# Patient Record
Sex: Female | Born: 1970
Health system: Southern US, Community
[De-identification: ages and names within clinical notes are randomized; demographics above are authoritative.]

## PROBLEM LIST (undated history)

## (undated) DIAGNOSIS — K5090 Crohn's disease, unspecified, without complications: Secondary | ICD-10-CM

## (undated) DIAGNOSIS — R569 Unspecified convulsions: Secondary | ICD-10-CM

## (undated) DIAGNOSIS — I639 Cerebral infarction, unspecified: Secondary | ICD-10-CM

---

## 2001-09-02 ENCOUNTER — Ambulatory Visit (HOSPITAL_COMMUNITY): Admission: RE | Admit: 2001-09-02 | Discharge: 2001-09-02 | Payer: Self-pay | Admitting: *Deleted

## 2001-09-30 ENCOUNTER — Other Ambulatory Visit: Admission: RE | Admit: 2001-09-30 | Discharge: 2001-09-30 | Payer: Self-pay | Admitting: *Deleted

## 2001-12-26 ENCOUNTER — Ambulatory Visit (HOSPITAL_COMMUNITY): Admission: RE | Admit: 2001-12-26 | Discharge: 2001-12-26 | Payer: Self-pay | Admitting: *Deleted

## 2001-12-26 ENCOUNTER — Encounter: Payer: Self-pay | Admitting: *Deleted

## 2002-03-17 ENCOUNTER — Ambulatory Visit (HOSPITAL_COMMUNITY): Admission: RE | Admit: 2002-03-17 | Discharge: 2002-03-17 | Payer: Self-pay | Admitting: *Deleted

## 2004-05-13 ENCOUNTER — Ambulatory Visit: Payer: Self-pay | Admitting: Gastroenterology

## 2004-10-19 ENCOUNTER — Emergency Department: Payer: Self-pay | Admitting: Emergency Medicine

## 2006-09-07 ENCOUNTER — Inpatient Hospital Stay: Payer: Self-pay | Admitting: Obstetrics and Gynecology

## 2006-11-27 ENCOUNTER — Observation Stay: Payer: Self-pay | Admitting: Obstetrics and Gynecology

## 2006-12-03 ENCOUNTER — Inpatient Hospital Stay: Payer: Self-pay | Admitting: Obstetrics and Gynecology

## 2006-12-10 ENCOUNTER — Ambulatory Visit: Payer: Self-pay | Admitting: Obstetrics and Gynecology

## 2007-05-06 ENCOUNTER — Ambulatory Visit: Payer: Self-pay | Admitting: Family Medicine

## 2008-05-15 IMAGING — US US EXTREM LOW VENOUS BILAT
1 series · 17 of 24 positions shown · non-contrast
Comparison: none

REASON FOR EXAM: SOB  both feet swelling back pain dizzy spells  recently
delivered c section...
COMMENTS:

[Series 1: us extrem low venous bilat · 17 of 38 slices shown]
[im 1/38]
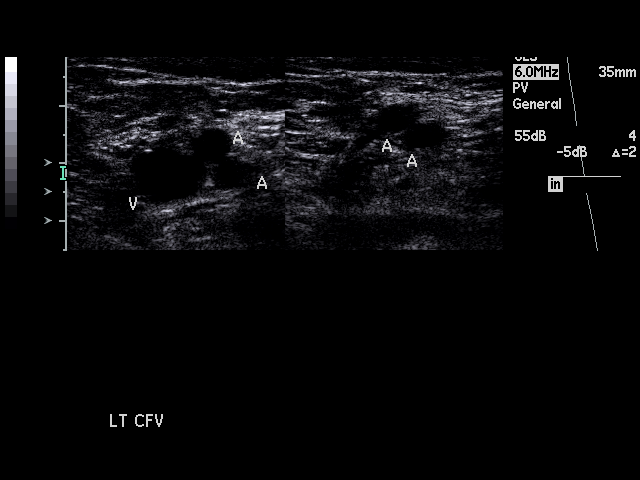
[im 4/38]
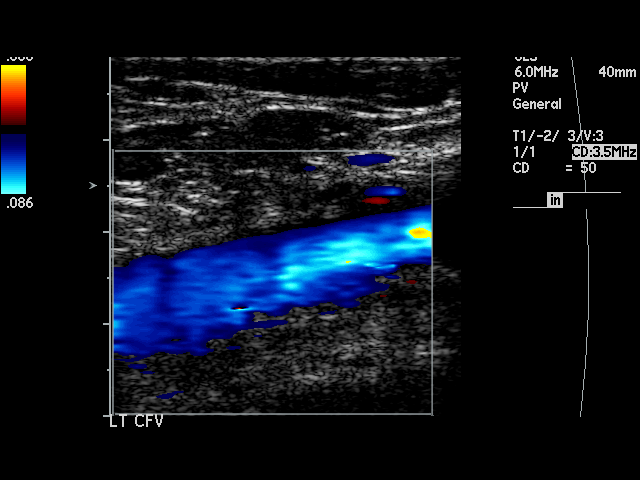
[im 5/38]
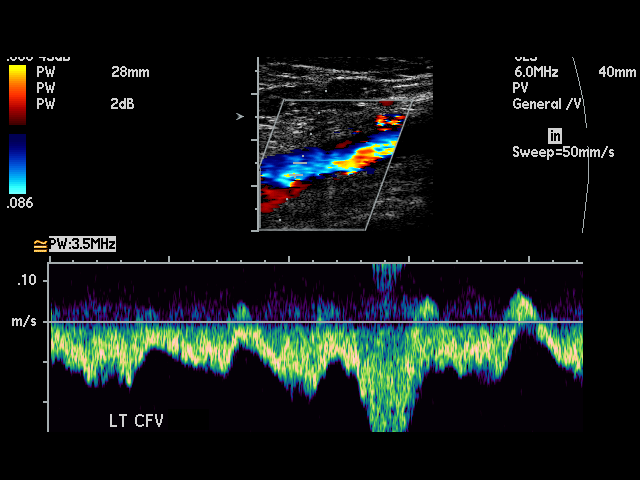
[im 7/38]
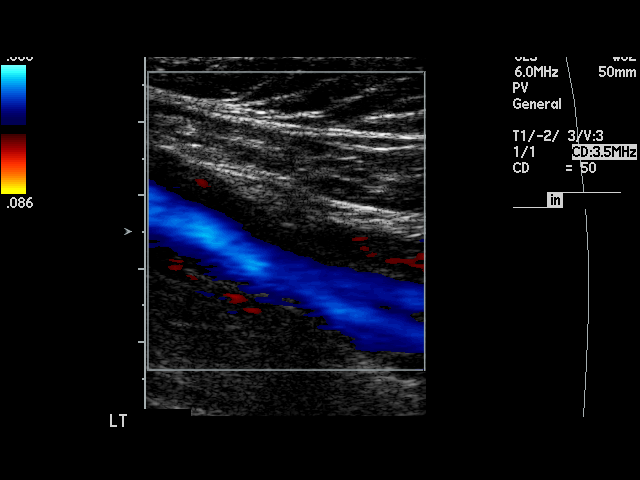
[im 10/38]
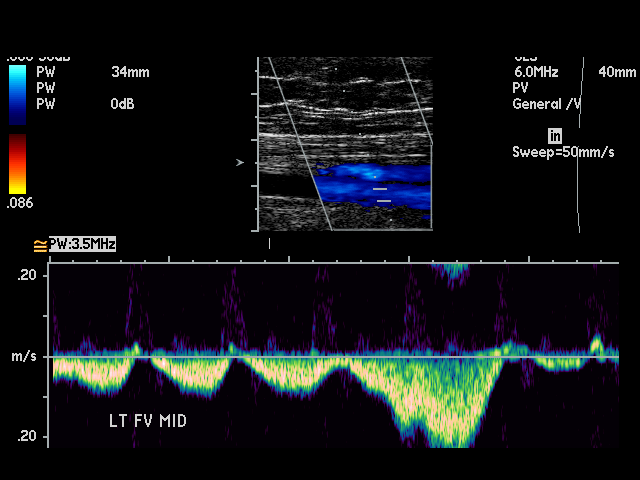
[im 12/38]
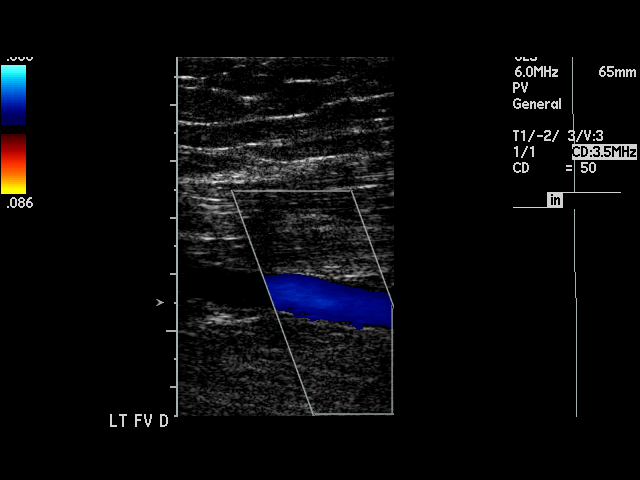
[im 15/38]
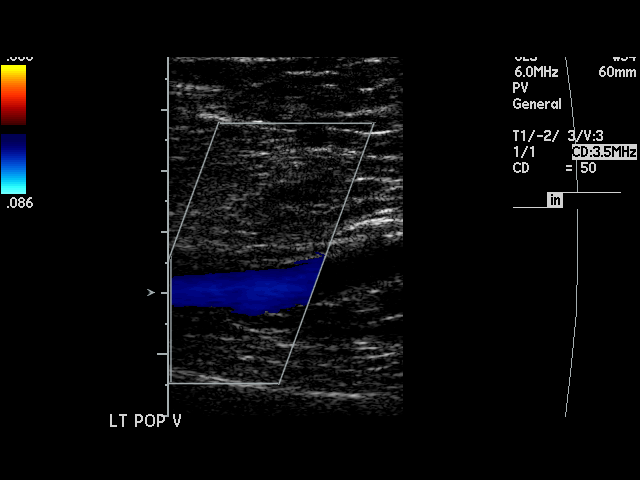
[im 17/38]
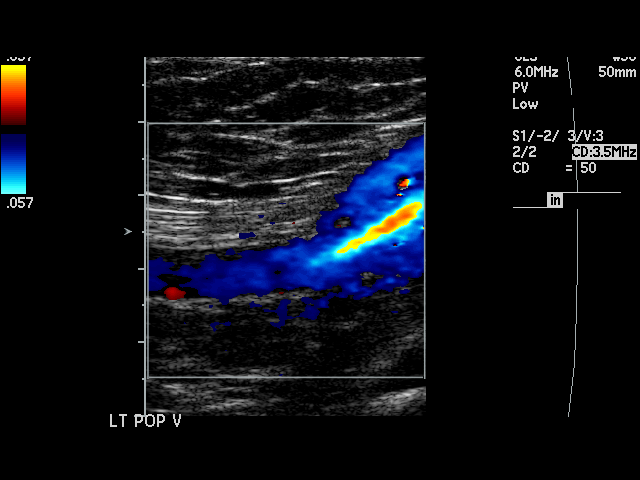
[im 20/38]
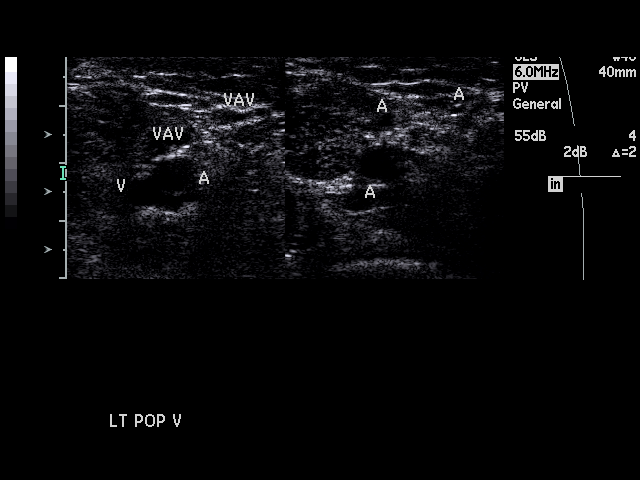
[im 21/38]
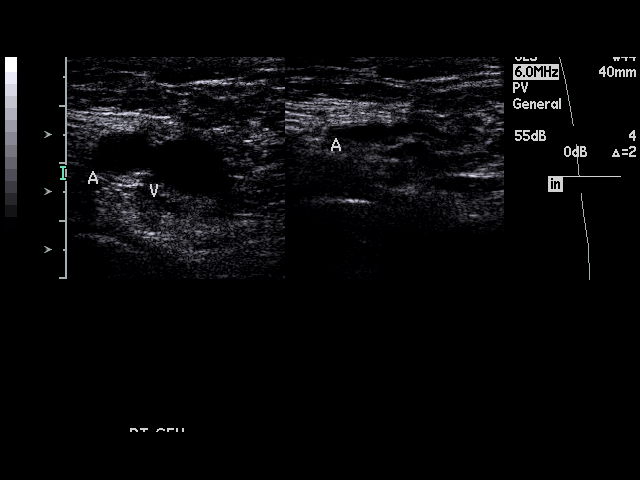
[im 23/38]
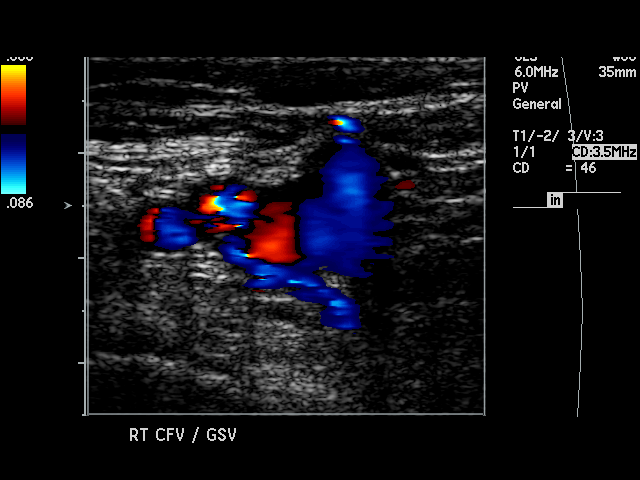
[im 26/38]
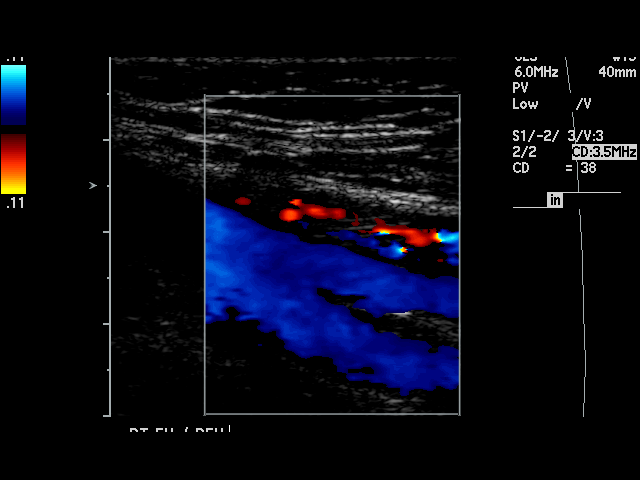
[im 28/38]
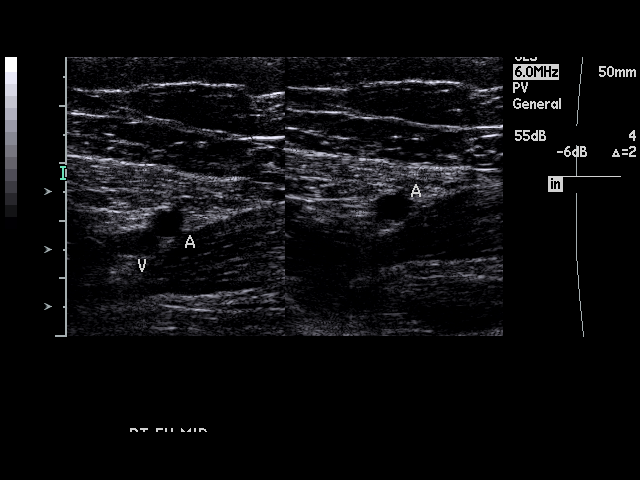
[im 31/38]
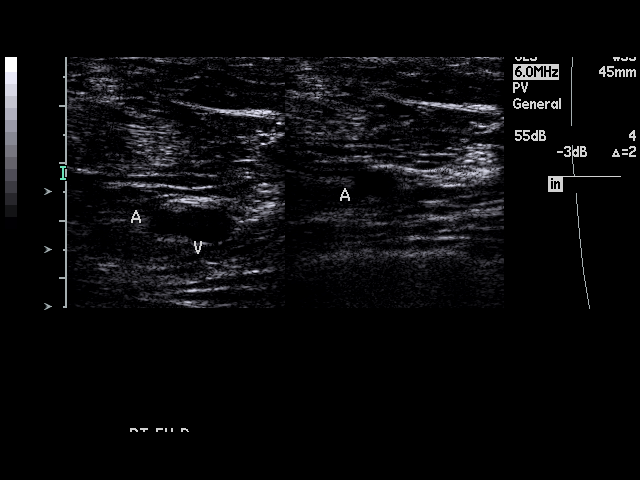
[im 33/38]
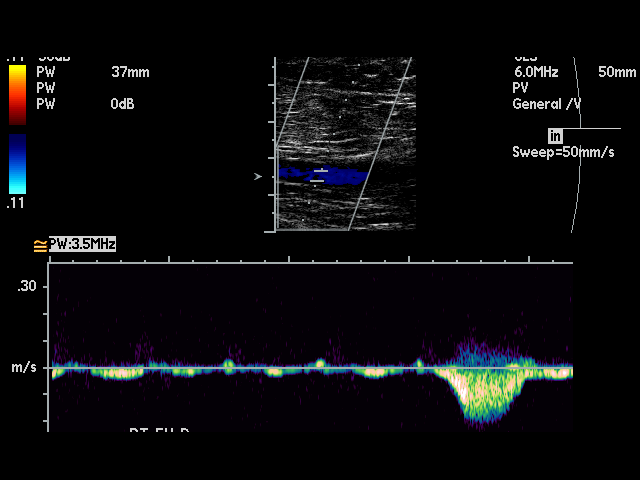
[im 34/38]
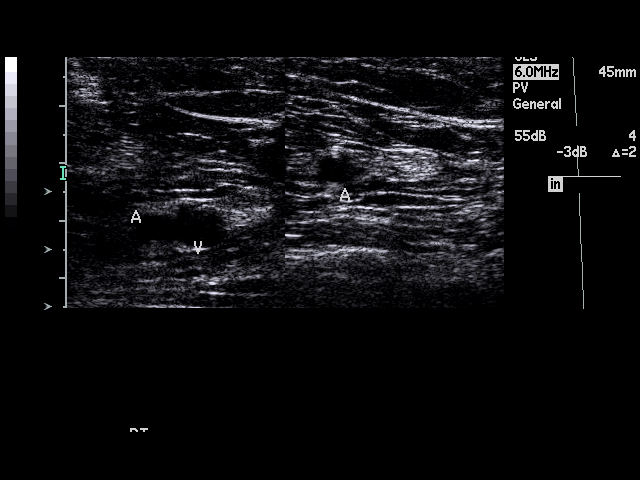
[im 38/38]
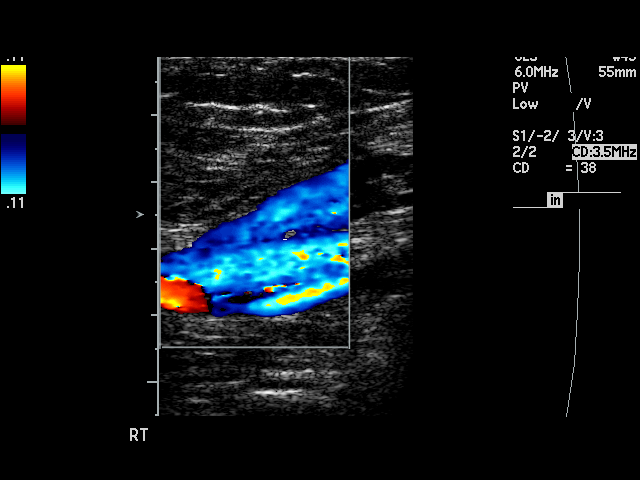

[17 of 24 positions shown; findings below may reference images not displayed]

PROCEDURE:     US  - US DOPPLER LOW EXTR BILATERAL  - December 10, 2006  [DATE]

RESULT:     Gray scale, Duplex, color flow and SPECTRAL waveform imaging was
performed of the deep venous structures of the RIGHT and LEFT lower
extremity.

There is no evidence of increased echogenicity or non-compressibility within
the interrogated deep venous structures of the RIGHT and LEFT lower
extremity. There is no evidence of abnormal waveform or gray scale flow
appreciated.
IMPRESSION: 1)No ultrasound evidence of deep venous thrombus within the RIGHT or LEFT
lower extremity as described above.

## 2008-10-09 IMAGING — CR CERVICAL SPINE - COMPLETE 4+ VIEW
1 series · 6 of 6 positions shown · non-contrast
Comparison: none

REASON FOR EXAM: vma, neck pain
COMMENTS:

[Series 1: view not recorded · 0.17mm/px · 6 of 6 slices shown]
[im 1/6]
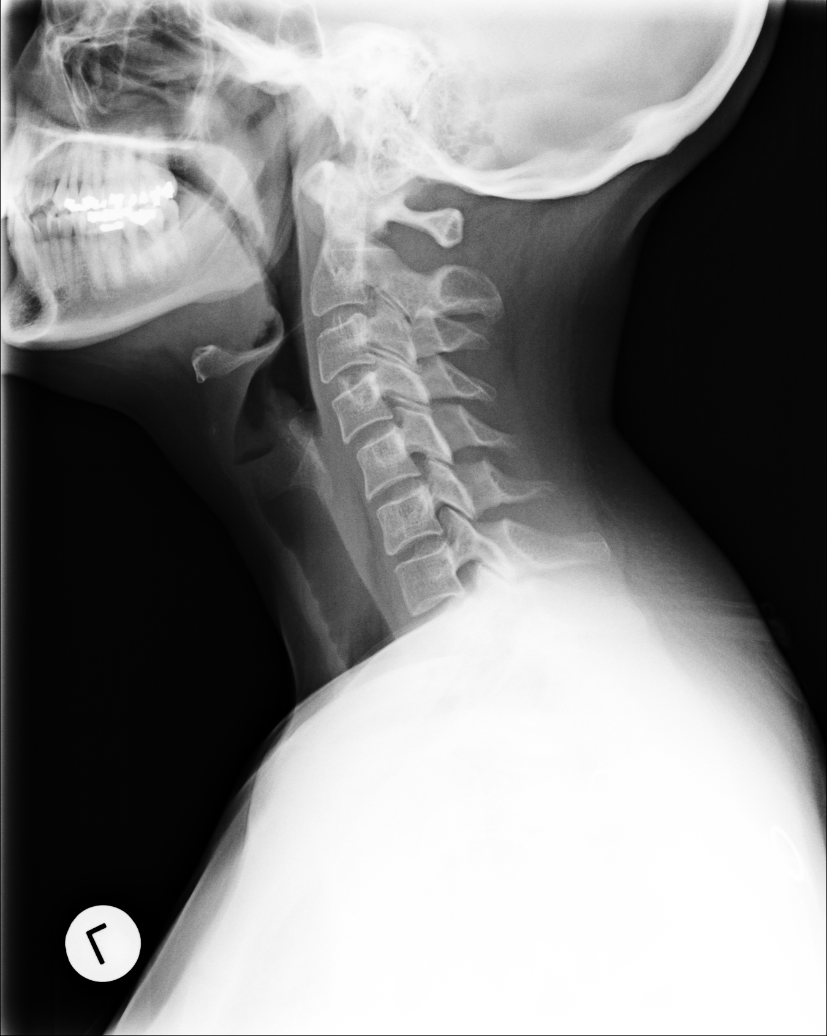
[im 2/6]
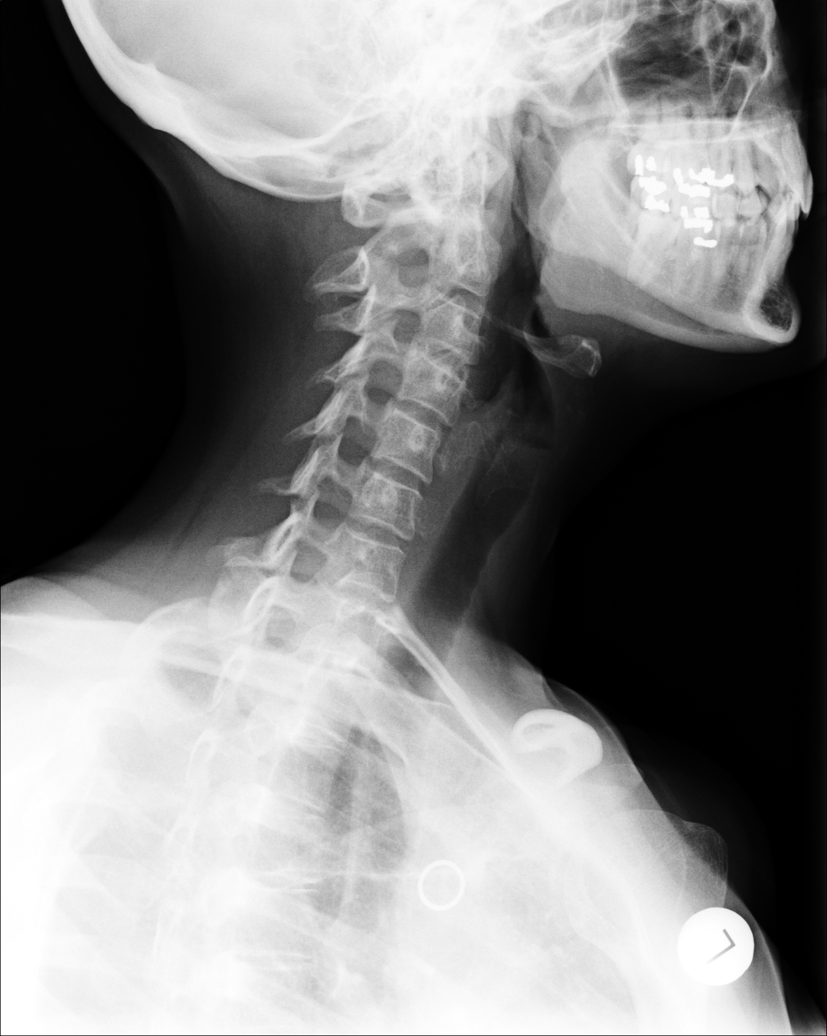
[im 3/6]
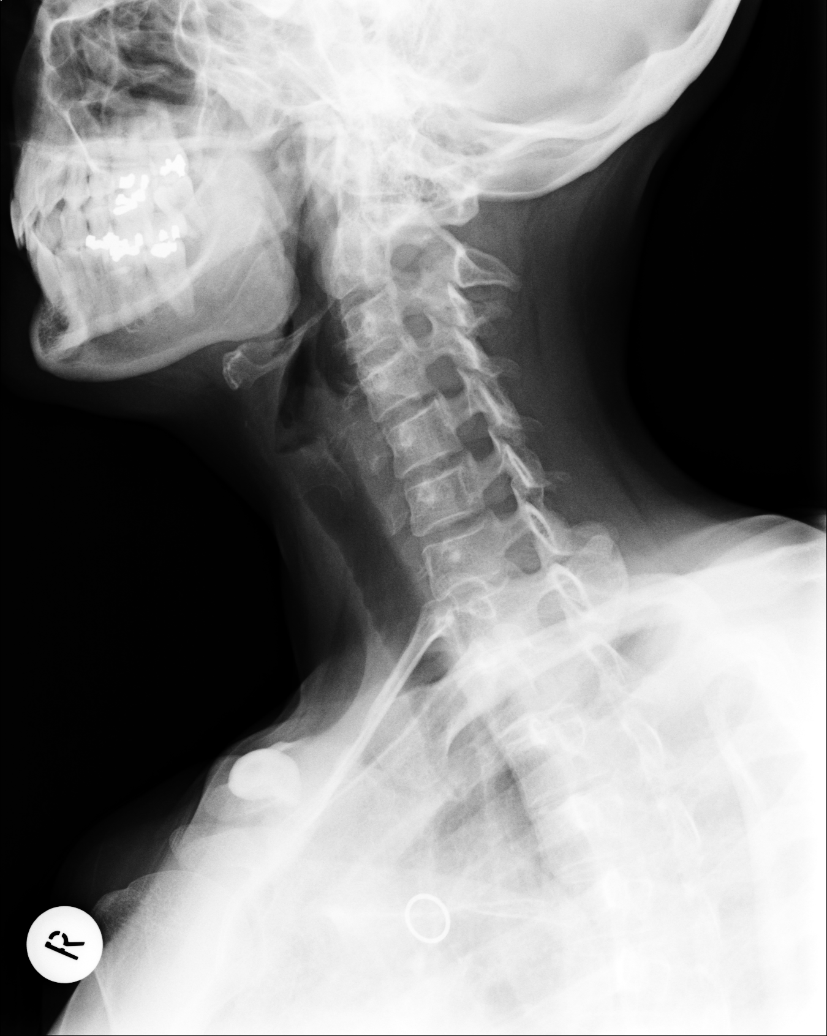
[im 4/6]
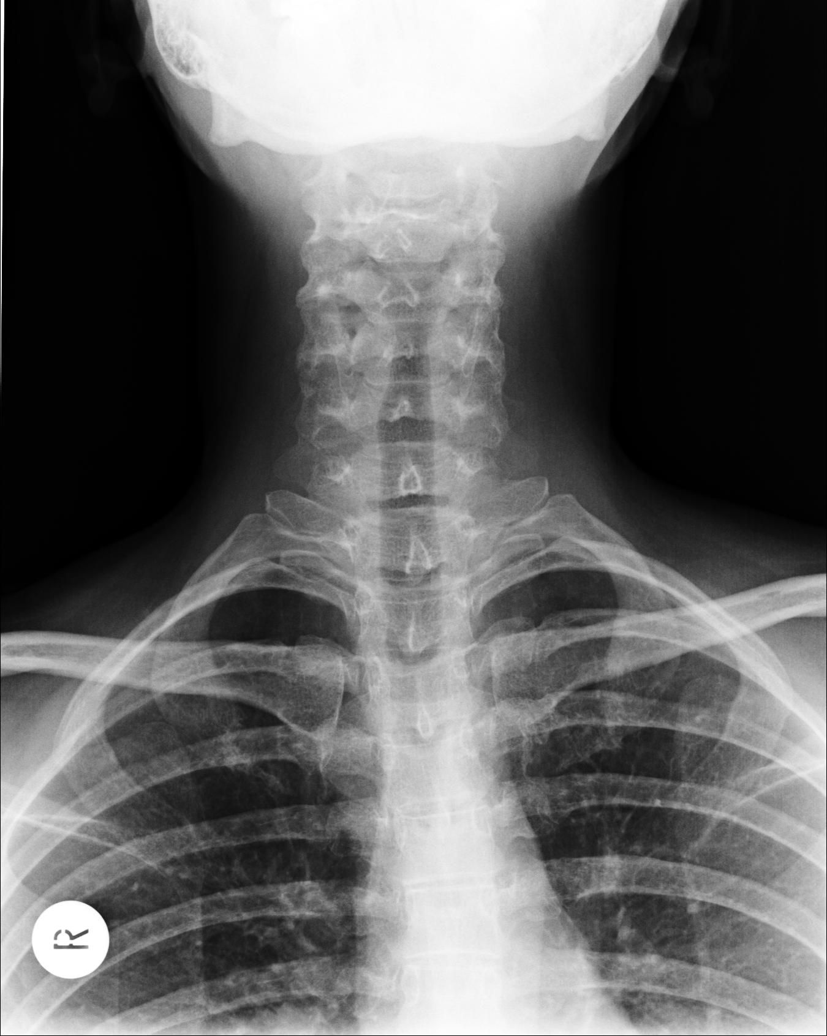
[im 5/6]
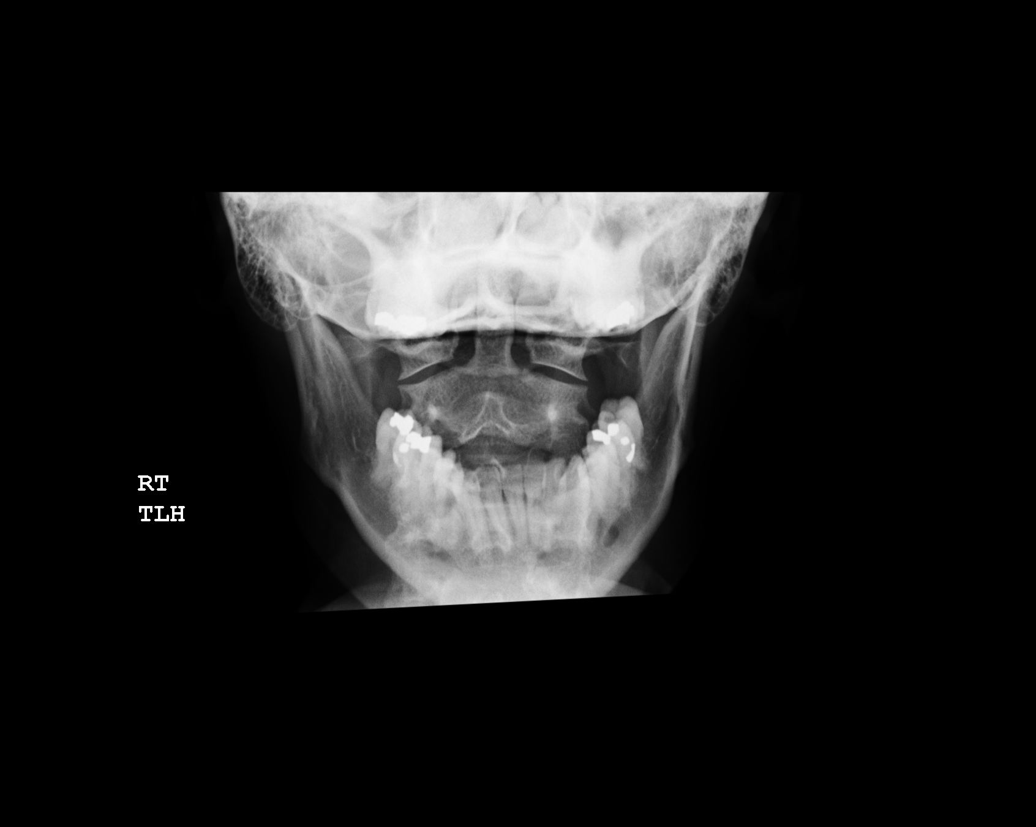
[im 6/6]
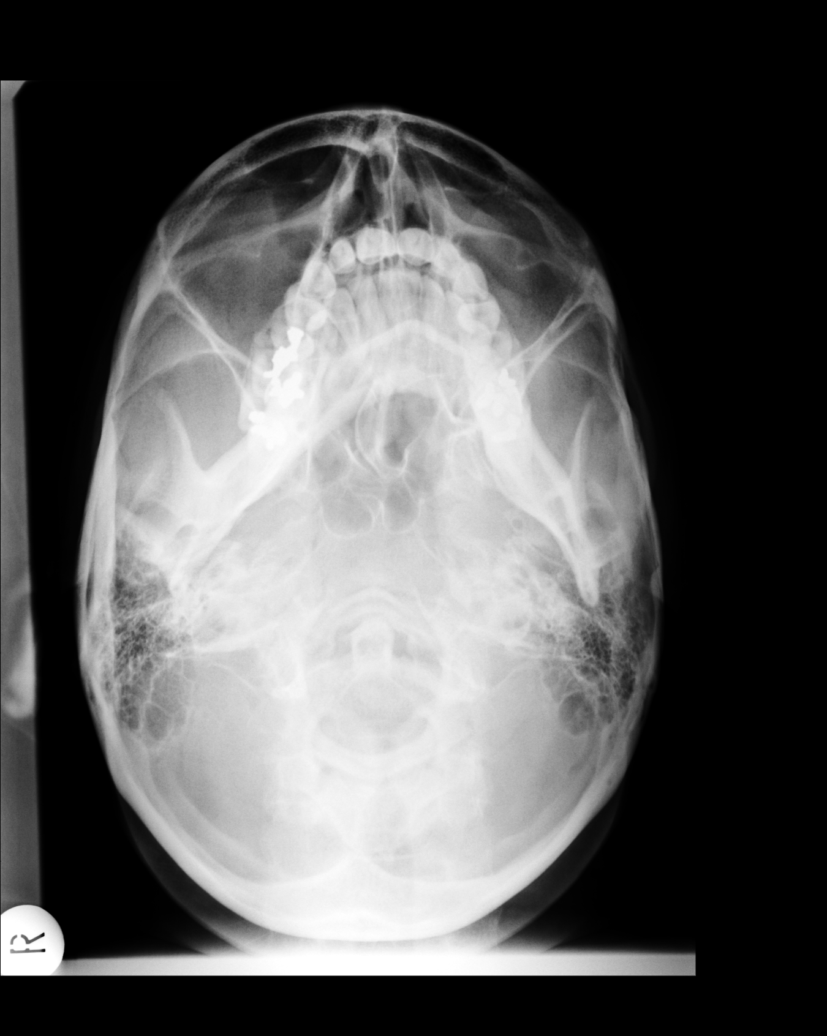

[6 of 6 positions shown; findings below may reference images not displayed]

PROCEDURE:     MDR - MDR CERVICAL SPINE COMPLETE  - May 06, 2007  [DATE]

RESULT:     The cervicovertebral bodies are preserved in height. The
prevertebral soft tissue spaces are normal in appearance. The posterior
elements appear intact. The oblique views reveal no bony encroachment upon
the neural foramina.
IMPRESSION: I do not see acute cervical spine abnormality.

## 2009-02-06 ENCOUNTER — Ambulatory Visit: Payer: Self-pay | Admitting: Internal Medicine

## 2010-03-29 ENCOUNTER — Ambulatory Visit: Payer: Self-pay | Admitting: Family Medicine

## 2010-08-05 ENCOUNTER — Ambulatory Visit: Payer: Self-pay | Admitting: Family Medicine

## 2011-05-13 ENCOUNTER — Inpatient Hospital Stay: Payer: Self-pay | Admitting: Internal Medicine

## 2011-06-19 DIAGNOSIS — K5090 Crohn's disease, unspecified, without complications: Secondary | ICD-10-CM | POA: Insufficient documentation

## 2011-12-24 ENCOUNTER — Ambulatory Visit: Payer: Self-pay | Admitting: Medical

## 2012-01-01 ENCOUNTER — Ambulatory Visit: Payer: Self-pay | Admitting: Obstetrics and Gynecology

## 2012-01-14 DIAGNOSIS — L409 Psoriasis, unspecified: Secondary | ICD-10-CM | POA: Insufficient documentation

## 2012-01-14 DIAGNOSIS — M2550 Pain in unspecified joint: Secondary | ICD-10-CM | POA: Insufficient documentation

## 2012-01-28 DIAGNOSIS — R519 Headache, unspecified: Secondary | ICD-10-CM | POA: Insufficient documentation

## 2012-02-22 DIAGNOSIS — D509 Iron deficiency anemia, unspecified: Secondary | ICD-10-CM | POA: Insufficient documentation

## 2012-02-22 DIAGNOSIS — Z7901 Long term (current) use of anticoagulants: Secondary | ICD-10-CM | POA: Insufficient documentation

## 2012-02-22 DIAGNOSIS — E876 Hypokalemia: Secondary | ICD-10-CM | POA: Insufficient documentation

## 2012-05-13 ENCOUNTER — Ambulatory Visit: Payer: Self-pay | Admitting: Internal Medicine

## 2012-05-15 ENCOUNTER — Ambulatory Visit: Payer: Self-pay

## 2012-05-17 LAB — WOUND CULTURE

## 2012-10-16 IMAGING — CR DG CHEST 1V PORT
1 series · 1 of 1 positions shown · non-contrast
Comparison: none

REASON FOR EXAM: seizure
COMMENTS:

PROCEDURE:     DXR - DXR PORTABLE CHEST SINGLE VIEW  - May 13, 2011  [DATE]
RESULT:     The lungs are clear. Heart size is normal.

[view not recorded]
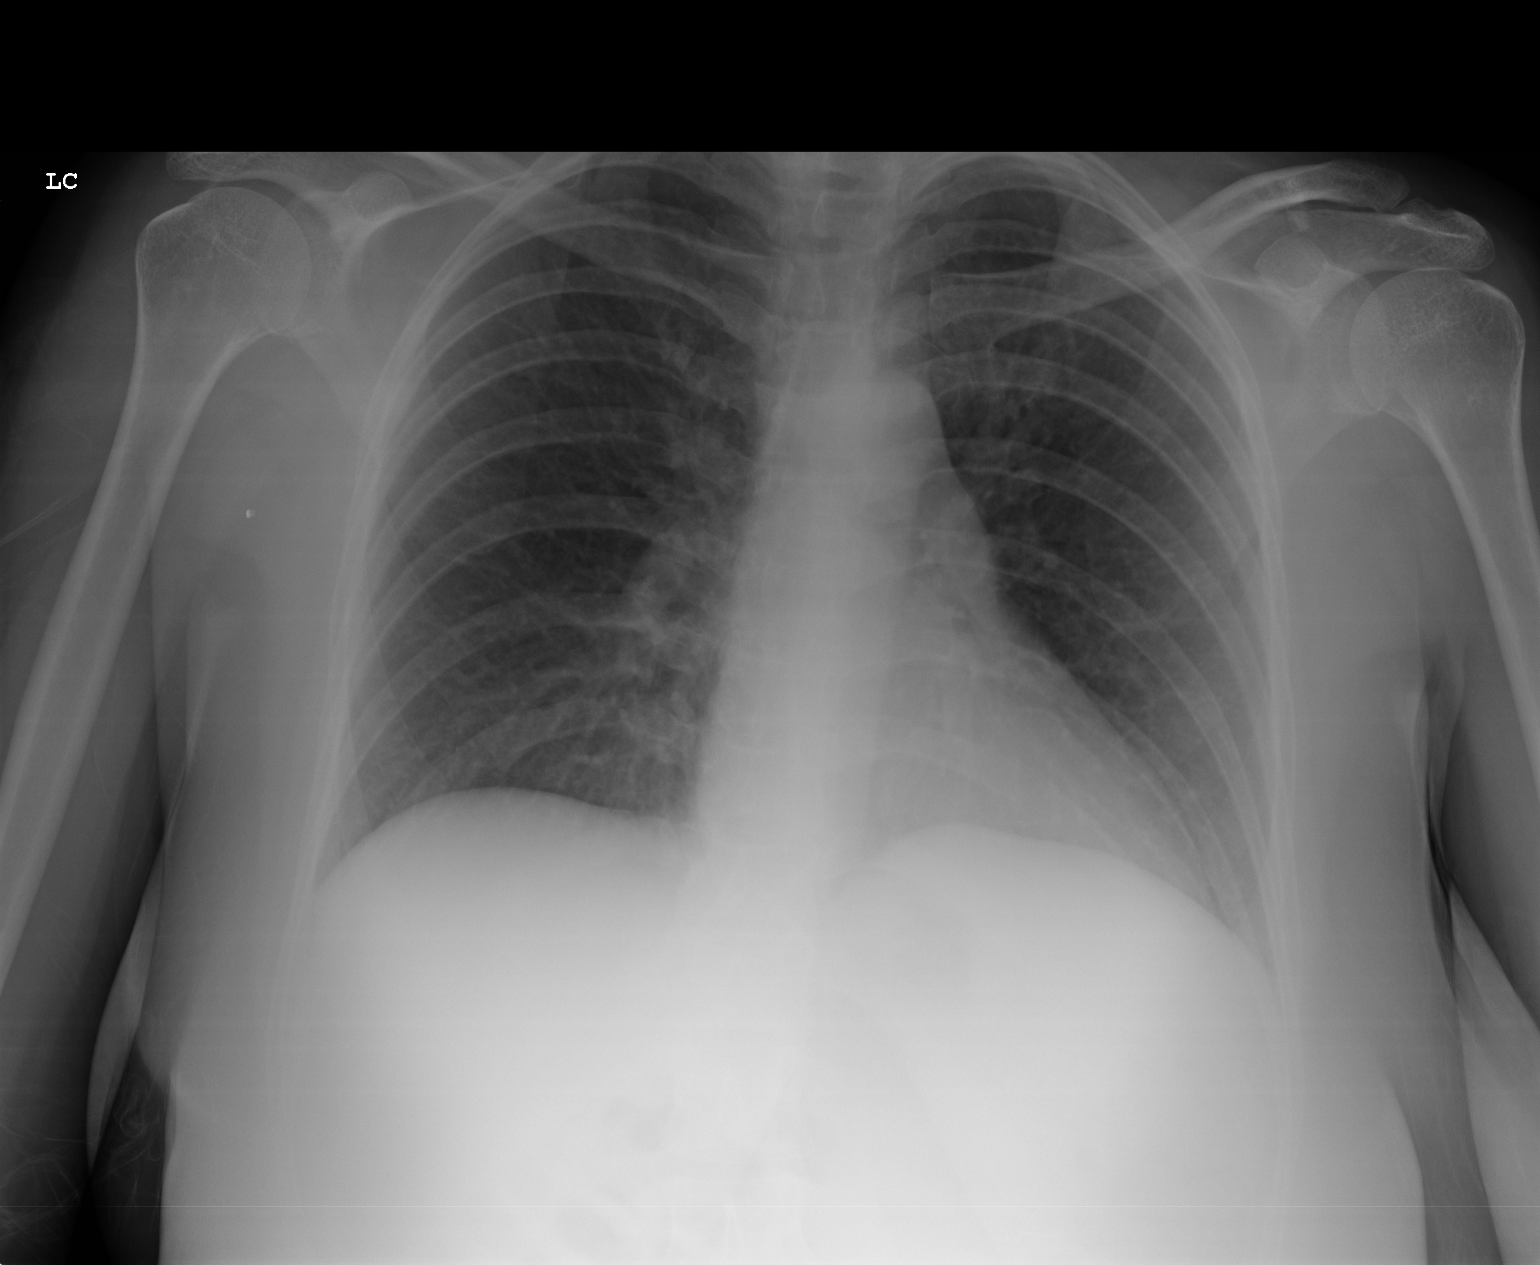

[1 of 1 positions shown; findings below may reference images not displayed]

IMPRESSION: No acute abnormality.

## 2013-08-08 ENCOUNTER — Ambulatory Visit: Payer: Self-pay | Admitting: Physician Assistant

## 2013-08-08 LAB — RAPID INFLUENZA A&B ANTIGENS

## 2013-08-08 LAB — RAPID STREP-A WITH REFLX: Micro Text Report: NEGATIVE

## 2013-08-10 LAB — BETA STREP CULTURE(ARMC)

## 2013-10-25 ENCOUNTER — Ambulatory Visit: Payer: Self-pay | Admitting: Family Medicine

## 2013-10-25 LAB — RAPID STREP-A WITH REFLX: Micro Text Report: NEGATIVE

## 2013-10-25 LAB — RAPID INFLUENZA A&B ANTIGENS

## 2013-10-27 LAB — BETA STREP CULTURE(ARMC)

## 2013-11-03 DIAGNOSIS — G8929 Other chronic pain: Secondary | ICD-10-CM | POA: Insufficient documentation

## 2016-05-08 ENCOUNTER — Encounter: Payer: Self-pay | Admitting: Emergency Medicine

## 2016-05-08 ENCOUNTER — Ambulatory Visit
Admission: EM | Admit: 2016-05-08 | Discharge: 2016-05-08 | Disposition: A | Discharge status: Home / Self Care | Payer: Self-pay | Attending: Family Medicine | Admitting: Family Medicine

## 2016-05-08 DIAGNOSIS — B029 Zoster without complications: Secondary | ICD-10-CM

## 2016-05-08 MED ORDER — FAMCICLOVIR 500 MG PO TABS: 500.0000 mg | ORAL_TABLET | Freq: Three times a day (TID) | ORAL | 0 refills | Status: DC

## 2016-05-08 MED ORDER — HYDROCODONE-ACETAMINOPHEN 5-325 MG PO TABS: 1.0000 | ORAL_TABLET | Freq: Four times a day (QID) | ORAL | 0 refills | Status: DC | PRN

## 2016-05-08 NOTE — ED Provider Notes (Signed)
CSN: [id number]     Arrival date & time 05/08/16  1321 History   First MD Initiated Contact with Patient 05/08/16 1432     Chief Complaint  Patient presents with  . Rash   (Consider location/radiation/quality/duration/timing/severity/associated sxs/prior Treatment) HPI  This 45 year old female who presents with burning rash over the left side of her anterior chest that started yesterday. She states that she had a very similar episode several months ago. She's been under great deal of stress with the death of her father and has never fully recovered. In the past used acyclovir but since that time has had almost continuous tingling in this area. She's had no fever or chills. Patient states that the rash is very painful and has a burning quality to it.      History reviewed. No pertinent past medical history. Past Surgical History:  Procedure Laterality Date  . CESAREAN SECTION     History reviewed. No pertinent family history. Social History  Substance Use Topics  . Smoking status: Never Smoker  . Smokeless tobacco: Never Used  . Alcohol use No   OB History    No data available     Review of Systems  Constitutional: Positive for activity change. Negative for chills, fatigue and fever.  Skin: Positive for color change and rash.  All other systems reviewed and are negative.   Allergies  Review of patient's allergies indicates no known allergies.  Home Medications   Prior to Admission medications   Medication Sig Start Date End Date Taking? Authorizing Provider  famciclovir (FAMVIR) 500 MG tablet Take 1 tablet (500 mg total) by mouth 3 (three) times daily. 05/08/16   Lutricia Feil, PA-C  HYDROcodone-acetaminophen (NORCO/VICODIN) 5-325 MG tablet Take 1 tablet by mouth every 6 (six) hours as needed for severe pain. 05/08/16   Lutricia Feil, PA-C   Meds Ordered and Administered this Visit  Medications - No data to display  BP (!) 154/88 (BP Location: Left Arm)   Pulse  80   Temp 97.9 F (36.6 C) (Tympanic)   Resp 16   Ht 5\' 4"  (1.626 m)   Wt 170 lb (77.1 kg)   LMP 04/17/2016 (Approximate)   SpO2 100%   BMI 29.18 kg/m  No data found.   Physical Exam  Constitutional: She is oriented to person, place, and time. She appears well-developed and well-nourished. No distress.  HENT:  Head: Normocephalic and atraumatic.  Eyes: EOM are normal. Pupils are equal, round, and reactive to light.  Neck: Normal range of motion. Neck supple.  Pulmonary/Chest: She exhibits tenderness.  Musculoskeletal: Normal range of motion.  Neurological: She is alert and oriented to person, place, and time.  Skin: Skin is warm and dry. Rash noted. She is not diaphoretic.  Examination of the left anterior chest shows a vesicular rash over the anterior chest wall not crossing midline. The patches about the hands with wide and hands length long . There are no excoriations present. Is not appear to be a secondary infection at the present time.  Psychiatric: She has a normal mood and affect. Her behavior is normal. Thought content normal.  Nursing note and vitals reviewed.   Urgent Care Course   Clinical Course    Procedures (including critical care time)  Labs Review Labs Reviewed - No data to display  Imaging Review No results found.   Visual Acuity Review  Right Eye Distance:   Left Eye Distance:   Bilateral Distance:    Right Eye  Near:   Left Eye Near:    Bilateral Near:         MDM   1. Shingles    Discharge Medication List as of 05/08/2016  2:48 PM    START taking these medications   Details  famciclovir (FAMVIR) 500 MG tablet Take 1 tablet (500 mg total) by mouth 3 (three) times daily., Starting Fri 05/08/2016, Normal    HYDROcodone-acetaminophen (NORCO/VICODIN) 5-325 MG tablet Take 1 tablet by mouth every 6 (six) hours as needed for severe pain., Starting Fri 05/08/2016, Print      Plan: 1. Test/x-ray results and diagnosis reviewed with  patient 2. rx as per orders; risks, benefits, potential side effects reviewed with patient 3. Recommend supportive treatment withAnti-inflammatories to help control her pain past her to use the narcotics so just for nighttime rest. She does have medical insurance available to her I would recommend that she see a neurologist.  4. F/u prn if symptoms worsen or don't improve     Lutricia FeilWilliam P Soliyana Mcchristian, PA-C 05/08/16 1459

## 2016-05-08 NOTE — ED Triage Notes (Signed)
Patient c/o burning rash on the left side of her chest that started yesterday.  Patient reports history of Shingles.

## 2016-05-17 ENCOUNTER — Telehealth: Payer: Self-pay

## 2016-05-17 NOTE — Telephone Encounter (Signed)
Spoke with patient and she mentioned that Colon FlatteryBill Roemer, PA was suppose to write her prescription for a refill on the Famvir. I informed her that Annette StableBill will be back on Tuesday or she is more than welcomed to come back in today and be seen. I spoke with Dr Thurmond ButtsWade and he felt he was best for her to be seen or speak with Bill on Tuesday.

## 2016-11-11 ENCOUNTER — Encounter: Payer: Self-pay | Admitting: *Deleted

## 2016-11-11 ENCOUNTER — Ambulatory Visit
Admission: EM | Admit: 2016-11-11 | Discharge: 2016-11-11 | Disposition: A | Discharge status: Home / Self Care | Payer: Self-pay | Attending: Family Medicine | Admitting: Family Medicine

## 2016-11-11 DIAGNOSIS — S40022A Contusion of left upper arm, initial encounter: Secondary | ICD-10-CM

## 2016-11-11 DIAGNOSIS — G43009 Migraine without aura, not intractable, without status migrainosus: Secondary | ICD-10-CM

## 2016-11-11 DIAGNOSIS — S40021A Contusion of right upper arm, initial encounter: Secondary | ICD-10-CM

## 2016-11-11 DIAGNOSIS — R21 Rash and other nonspecific skin eruption: Secondary | ICD-10-CM

## 2016-11-11 HISTORY — DX: Unspecified convulsions: R56.9

## 2016-11-11 HISTORY — DX: Cerebral infarction, unspecified: I63.9

## 2016-11-11 HISTORY — DX: Crohn's disease, unspecified, without complications: K50.90

## 2016-11-11 LAB — CBC WITH DIFFERENTIAL/PLATELET
BASOS ABS: 0 10*3/uL (ref 0–0.1)
BASOS PCT: 0 %
Eosinophils Absolute: 0.1 10*3/uL (ref 0–0.7)
Eosinophils Relative: 1 %
HEMATOCRIT: 39.5 % (ref 35.0–47.0)
HEMOGLOBIN: 13.8 g/dL (ref 12.0–16.0)
LYMPHS PCT: 28 %
Lymphs Abs: 1.9 10*3/uL (ref 1.0–3.6)
MCH: 31 pg (ref 26.0–34.0)
MCHC: 34.9 g/dL (ref 32.0–36.0)
MCV: 89 fL (ref 80.0–100.0)
MONO ABS: 0.5 10*3/uL (ref 0.2–0.9)
MONOS PCT: 7 %
NEUTROS ABS: 4.3 10*3/uL (ref 1.4–6.5)
NEUTROS PCT: 64 %
Platelets: 347 10*3/uL (ref 150–440)
RBC: 4.44 MIL/uL (ref 3.80–5.20)
RDW: 13.2 % (ref 11.5–14.5)
WBC: 6.7 10*3/uL (ref 3.6–11.0)

## 2016-11-11 LAB — COMPREHENSIVE METABOLIC PANEL
ALT: 14 U/L (ref 14–54)
AST: 18 U/L (ref 15–41)
Albumin: 4.4 g/dL (ref 3.5–5.0)
Alkaline Phosphatase: 82 U/L (ref 38–126)
Anion gap: 7 (ref 5–15)
BUN: 11 mg/dL (ref 6–20)
CHLORIDE: 108 mmol/L (ref 101–111)
CO2: 21 mmol/L — AB (ref 22–32)
Calcium: 8.8 mg/dL — ABNORMAL LOW (ref 8.9–10.3)
Creatinine, Ser: 0.79 mg/dL (ref 0.44–1.00)
Glucose, Bld: 88 mg/dL (ref 65–99)
POTASSIUM: 3.7 mmol/L (ref 3.5–5.1)
SODIUM: 136 mmol/L (ref 135–145)
Total Bilirubin: 1.6 mg/dL — ABNORMAL HIGH (ref 0.3–1.2)
Total Protein: 7.8 g/dL (ref 6.5–8.1)

## 2016-11-11 MED ORDER — PROMETHAZINE HCL 25 MG/ML IJ SOLN
25.0000 mg | Freq: Once | INTRAMUSCULAR | Status: AC
  Administered 2016-11-11: 25 mg via INTRAMUSCULAR

## 2016-11-11 MED ORDER — TOPIRAMATE 50 MG PO TABS: ORAL_TABLET | ORAL | 0 refills | Status: DC

## 2016-11-11 MED ORDER — PROMETHAZINE HCL 25 MG/ML IJ SOLN: 25.0000 mg | Freq: Once | INTRAMUSCULAR | Status: DC

## 2016-11-11 MED ORDER — QUETIAPINE FUMARATE 25 MG PO TABS: 25.0000 mg | ORAL_TABLET | Freq: Every day | ORAL | 0 refills | Status: DC

## 2016-11-11 MED ORDER — KETOROLAC TROMETHAMINE 60 MG/2ML IM SOLN
60.0000 mg | Freq: Once | INTRAMUSCULAR | Status: AC
  Administered 2016-11-11: 60 mg via INTRAMUSCULAR

## 2016-11-11 MED ORDER — ONDANSETRON 8 MG PO TBDP
8.0000 mg | ORAL_TABLET | Freq: Once | ORAL | Status: AC
  Administered 2016-11-11: 8 mg via ORAL

## 2016-11-11 NOTE — ED Provider Notes (Signed)
[facility]    CSN: [id number] Arrival date & time: 11/11/16  1113     History   Chief Complaint Chief Complaint  Patient presents with  . Rash  . Migraine    HPI Allison Brock is a 46 y.o. female.   Patient is here because of multiple issues   #1 rash she has a rash on both forearms most worse on the right forearm than the left. It appears be almost circular bruising. Stenosis about 7 days ago seems be getting worse. Crohn's disease has multiple medical problems does not have any insurance she states that she has no again medications and that she is states the rash doesn't itch or cause her any significant problems right now. She's had a history of anemia but denies any history of ITP or other blood dyscrasia. She did have a stroke about 10 years ago.  #2 migraines. She states she has a history of migraines. This lady's migraine has gotten worse. Apparently when she had this stroke she also developed a seizure disorder she's not had a seizure in about 3 years. Initially she was told me she was taking Keppra for the migraines but when questioned it turns out or appears to be the Keppra was for the seizure disorder and that she was asked taking Topamax for the prevention of migraine. We'll migraine does act up she takes a dose of Seroquel to go home and rest in the migraine gets better. She has also had nausea and vomiting with this migraine as well. She is out of her Keppra she's not had a seizure in 3 years and she is out of her migraine medication. Has a history of stroke would be reluctant to consciously using Imitrex less than usual could tolerate it before hand.   The history is provided by the patient. No language interpreter was used.  Rash  Location:  Shoulder/arm Shoulder/arm rash location:  L forearm and R forearm Quality: bruising and redness   Severity:  Moderate Onset quality:  Sudden Timing:  Constant Progression:  Worsening Chronicity:  New Relieved  by:  Nothing Worsened by:  Nothing Ineffective treatments:  None tried Associated symptoms: headaches   Associated symptoms: no abdominal pain   Migraine  This is a recurrent problem. The current episode started more than 2 days ago. The problem occurs constantly. The problem has not changed since onset.Associated symptoms include headaches. Pertinent negatives include no chest pain and no abdominal pain. Nothing aggravates the symptoms. She has tried acetaminophen for the symptoms. The treatment provided no relief.    Past Medical History:  Diagnosis Date  . Crohn disease (HCC)   . Seizures (HCC)   . Stroke [facility])     There are no active problems to display for this patient.   Past Surgical History:  Procedure Laterality Date  . CESAREAN SECTION      OB History    No data available       Home Medications    Prior to Admission medications   Medication Sig Start Date End Date Taking? Authorizing Provider  famciclovir (FAMVIR) 500 MG tablet Take 1 tablet (500 mg total) by mouth 3 (three) times daily. 05/08/16   Lutricia Feil, PA-C  HYDROcodone-acetaminophen (NORCO/VICODIN) 5-325 MG tablet Take 1 tablet by mouth every 6 (six) hours as needed for severe pain. 05/08/16   Lutricia Feil, PA-C  QUEtiapine (SEROQUEL) 25 MG tablet Take 1 tablet (25 mg total) by mouth at bedtime. Use for  treatment of migraines 11/11/16   Hassan Rowan, MD  topiramate (TOPAMAX) 50 MG tablet Take 1/2-1 tablet twice a day for prevention of migraines 11/11/16   Hassan Rowan, MD    Family History History reviewed. No pertinent family history.  Social History Social History  Substance Use Topics  . Smoking status: Never Smoker  . Smokeless tobacco: Never Used  . Alcohol use No     Allergies   Ivp dye [iodinated diagnostic agents]   Review of Systems Review of Systems  Cardiovascular: Negative for chest pain and leg swelling.  Gastrointestinal: Negative for abdominal pain.  Skin: Positive for  rash.  Neurological: Positive for headaches. Negative for weakness.  All other systems reviewed and are negative.    Physical Exam Triage Vital Signs ED Triage Vitals  Enc Vitals Group     BP 11/11/16 1150 116/60     Pulse Rate 11/11/16 1150 75     Resp 11/11/16 1150 16     Temp 11/11/16 1150 98.7 F (37.1 C)     Temp Source 11/11/16 1150 Oral     SpO2 11/11/16 1150 99 %     Weight 11/11/16 1152 159 lb (72.1 kg)     Height 11/11/16 1152  (1.549 m)     Head Circumference --      Peak Flow --      Pain Score --      Pain Loc --      Pain Edu? --      Excl. in GC? --    No data found.   Updated Vital Signs BP 116/60 (BP Location: Left Arm)   Pulse 75   Temp 98.7 F (37.1 C) (Oral)   Resp 16   Ht  (1.549 m)   Wt 159 lb (72.1 kg)   SpO2 99%   BMI 30.04 kg/m   Visual Acuity Right Eye Distance:   Left Eye Distance:   Bilateral Distance:    Right Eye Near:   Left Eye Near:    Bilateral Near:     Physical Exam  Constitutional: She is oriented to person, place, and time. She appears well-developed and well-nourished.  HENT:  Head: Normocephalic and atraumatic.  Right Ear: External ear normal.  Left Ear: External ear normal.  Eyes: Conjunctivae are normal. Pupils are equal, round, and reactive to light.  Neck: Normal range of motion. Neck supple.  Cardiovascular: Normal rate, regular rhythm and normal heart sounds.   Pulmonary/Chest: Effort normal and breath sounds normal.  Musculoskeletal: She exhibits tenderness.  Patient has diffuse tenderness over her arms  Neurological: She is alert and oriented to person, place, and time. No cranial nerve deficit. Coordination normal.  Skin: Rash noted. There is erythema.  Psychiatric: Her mood appears anxious. She exhibits a depressed mood.  Patient is actively crying in the room.  Vitals reviewed.    UC Treatments / Results  Labs (all labs ordered are listed, but only abnormal results are displayed) Labs  Reviewed  COMPREHENSIVE METABOLIC PANEL - Abnormal; Notable for the following:       Result Value   CO2 21 (*)    Calcium 8.8 (*)    Total Bilirubin 1.6 (*)    All other components within normal limits  CBC WITH DIFFERENTIAL/PLATELET    EKG  EKG Interpretation None       Radiology No results found.  Procedures Procedures (including critical care time)  Medications Ordered in UC Medications  ondansetron (ZOFRAN-ODT) disintegrating tablet  8 mg (8 mg Oral Given 11/11/16 1308)  ketorolac (TORADOL) injection 60 mg (60 mg Intramuscular Given 11/11/16 1404)  promethazine (PHENERGAN) injection 25 mg (25 mg Intramuscular Given 11/11/16 1405)     Initial Impression / Assessment and Plan / UC Course  I have reviewed the triage vital signs and the nursing notes.  Pertinent labs & imaging results that were available during my care of the patient were reviewed by me and considered in my medical decision making (see chart for details).   after being here for a prolonged amount time orders to have blood work from not follow through as fast as I would've liked it turns out the CBC machine here at [facility] is down. Initially I did not want to give her Toradol injection to had a CBC Back but maybe another hour. We'll allow her to go home we'll give her 25 mg Phenergan IM since Zofran 8 mg ODT actually made the nausea seems to be somewhat worse. We'll also give her a 25 mg shot of Phenergan she states she has someone come pick her up and take her home. Will give her note for work for today and tomorrow will go back work on Friday. We will have to notify her of the platelet count. She's also has some lites imbalance and will wait for the CMP for the lingula ago. Since that can be done here locally.  We'll give her prescription for Topamax 50 milligrams 1/2-1 twice a day aggregate dimension and Seroquel 25 mg daily as needed for treatment of migraines . Final Clinical Impressions(s) / UC Diagnoses   Final  diagnoses:  Rash  Bruise of both arms  Migraine without aura and without status migrainosus, not intractable    New Prescriptions Discharge Medication List as of 11/11/2016  2:11 PM    START taking these medications   Details  QUEtiapine (SEROQUEL) 25 MG tablet Take 1 tablet (25 mg total) by mouth at bedtime. Use for treatment of migraines, Starting Wed 11/11/2016, Normal    topiramate (TOPAMAX) 50 MG tablet Take 1/2-1 tablet twice a day for prevention of migraines, Normal         Hassan Rowan, MD 11/11/16 [phone]

## 2016-11-11 NOTE — ED Triage Notes (Signed)
Rash/discoloration to arms, also c/o migraine since last night.

## 2016-12-03 ENCOUNTER — Ambulatory Visit (INDEPENDENT_AMBULATORY_CARE_PROVIDER_SITE_OTHER): Payer: Self-pay | Admitting: Family Medicine

## 2016-12-03 VITALS — BP 146/110 | HR 80 | Ht 63.0 in | Wt 159.0 lb

## 2016-12-03 DIAGNOSIS — Z8719 Personal history of other diseases of the digestive system: Secondary | ICD-10-CM

## 2016-12-03 DIAGNOSIS — R030 Elevated blood-pressure reading, without diagnosis of hypertension: Secondary | ICD-10-CM

## 2016-12-03 DIAGNOSIS — Z8669 Personal history of other diseases of the nervous system and sense organs: Secondary | ICD-10-CM

## 2016-12-03 DIAGNOSIS — Z7689 Persons encountering health services in other specified circumstances: Secondary | ICD-10-CM

## 2016-12-03 DIAGNOSIS — F419 Anxiety disorder, unspecified: Secondary | ICD-10-CM

## 2016-12-03 DIAGNOSIS — Z8673 Personal history of transient ischemic attack (TIA), and cerebral infarction without residual deficits: Secondary | ICD-10-CM

## 2016-12-03 MED ORDER — TOPIRAMATE 50 MG PO TABS: ORAL_TABLET | ORAL | 0 refills | Status: DC

## 2016-12-03 MED ORDER — LISINOPRIL-HYDROCHLOROTHIAZIDE 10-12.5 MG PO TABS: 1.0000 | ORAL_TABLET | Freq: Every day | ORAL | 3 refills | Status: DC

## 2016-12-03 NOTE — Addendum Note (Signed)
Addended by: Everitt Amber on: 12/03/2016 04:07 PM   Modules accepted: Orders

## 2016-12-03 NOTE — Progress Notes (Signed)
Name: Allison Brock   MRN: [id number]    DOB: 1970/10/19   Date:12/03/2016       Progress Note  Subjective  Chief Complaint  Chief Complaint  Patient presents with  . Establish Care  . Migraine    needs referral to neurology    Patient has history of crohn's,seizures, stroke, and migraines.   Migraine   This is a chronic (been out of topamax) problem. The current episode started more than 1 year ago. The problem occurs intermittently. The problem has been unchanged. The pain is located in the retro-orbital and left unilateral region. The pain does not radiate. The quality of the pain is described as throbbing. The pain is moderate. Associated symptoms include blurred vision, numbness, phonophobia, photophobia, seizures and a visual change. Pertinent negatives include no abdominal pain, back pain, coughing, dizziness, ear pain, eye watering, fever, insomnia, loss of balance, nausea, neck pain, rhinorrhea, sore throat, tingling or weight loss. Associated symptoms comments: Facial numbness.    No problem-specific Assessment & Plan notes found for this encounter.   Past Medical History:  Diagnosis Date  . Crohn disease (HCC)   . Seizures (HCC)   . Stroke [facility])     Past Surgical History:  Procedure Laterality Date  . CESAREAN SECTION      No family history on file.  Social History   Social History  . Marital status: Divorced    Spouse name: N/A  . Number of children: N/A  . Years of education: N/A   Occupational History  . Not on file.   Social History Main Topics  . Smoking status: Never Smoker  . Smokeless tobacco: Never Used  . Alcohol use No  . Drug use: No  . Sexual activity: Not on file   Other Topics Concern  . Not on file   Social History Narrative  . No narrative on file    Allergies  Allergen Reactions  . Ivp Dye [Iodinated Diagnostic Agents] Hives    Outpatient Medications Prior to Visit  Medication Sig Dispense Refill  .  HYDROcodone-acetaminophen (NORCO/VICODIN) 5-325 MG tablet Take 1 tablet by mouth every 6 (six) hours as needed for severe pain. 10 tablet 0  . QUEtiapine (SEROQUEL) 25 MG tablet Take 1 tablet (25 mg total) by mouth at bedtime. Use for treatment of migraines 6 tablet 0  . topiramate (TOPAMAX) 50 MG tablet Take 1/2-1 tablet twice a day for prevention of migraines 60 tablet 0  . famciclovir (FAMVIR) 500 MG tablet Take 1 tablet (500 mg total) by mouth 3 (three) times daily. 21 tablet 0   No facility-administered medications prior to visit.     Review of Systems  Constitutional: Negative for chills, fever, malaise/fatigue and weight loss.  HENT: Negative for ear discharge, ear pain, nosebleeds, rhinorrhea and sore throat.   Eyes: Positive for blurred vision and photophobia.  Respiratory: Positive for shortness of breath. Negative for cough, sputum production and wheezing.        With exertion  Cardiovascular: Negative for chest pain, palpitations and leg swelling.  Gastrointestinal: Negative for abdominal pain, blood in stool, constipation, diarrhea, heartburn, melena and nausea.  Genitourinary: Negative for dysuria, frequency, hematuria and urgency.  Musculoskeletal: Negative for back pain, joint pain, myalgias and neck pain.  Skin: Positive for rash.  Neurological: Positive for seizures and numbness. Negative for dizziness, tingling, sensory change, focal weakness, headaches and loss of balance.  Endo/Heme/Allergies: Negative for environmental allergies and polydipsia. Bruises/bleeds easily.  Psychiatric/Behavioral: Negative for  depression and suicidal ideas. The patient is not nervous/anxious and does not have insomnia.      Objective  Vitals:   12/03/16 1440 12/03/16 1515  BP: (!) 150/120 (!) 146/110  Pulse: 80   Weight: 159 lb (72.1 kg)   Height:  (1.6 m)     Physical Exam  Constitutional: She is well-developed, well-nourished, and in no distress. No distress.  HENT:  Head:  Normocephalic and atraumatic.  Right Ear: External ear normal.  Left Ear: External ear normal.  Nose: Nose normal.  Mouth/Throat: Oropharynx is clear and moist.  Eyes: Conjunctivae and EOM are normal. Pupils are equal, round, and reactive to light. Right eye exhibits no discharge. Left eye exhibits no discharge.  Neck: Normal range of motion. Neck supple. No JVD present. No thyromegaly present.  Cardiovascular: Normal rate, regular rhythm, normal heart sounds and intact distal pulses.  Exam reveals no gallop and no friction rub.   No murmur heard. Pulmonary/Chest: Effort normal and breath sounds normal. She has no wheezes. She has no rales.  Abdominal: Soft. Bowel sounds are normal. She exhibits no mass. There is no tenderness. There is no guarding.  Musculoskeletal: Normal range of motion. She exhibits no edema.  Lymphadenopathy:    She has no cervical adenopathy.  Neurological: She is alert. She has normal reflexes.  Skin: Skin is warm and dry. She is not diaphoretic.  Psychiatric: Mood and affect normal.  Nursing note and vitals reviewed.     Assessment & Plan  Problem List Items Addressed This Visit    None    Visit Diagnoses    Encounter to establish care with new doctor    -  Primary   Hx of migraines       referral [phone]   Relevant Medications   topiramate (TOPAMAX) 50 MG tablet   Hx of seizure disorder       above   Hx of ischemic left MCA stroke       above   Relevant Medications   lisinopril-hydrochlorothiazide (PRINZIDE,ZESTORETIC) 10-12.5 MG tablet   Hx of Crohn's disease       referral [facility] [phone]   Elevated BP without diagnosis of hypertension       Anxiety       cont Dr Omelia Blackwater   Relevant Medications   ALPRAZolam Prudy Feeler) 1 MG tablet      Meds ordered this encounter  Medications  . topiramate (TOPAMAX) 50 MG tablet    Sig: Take 1/2-1 tablet twice a day for prevention of migraines    Dispense:  60 tablet     Refill:  0  . lisinopril-hydrochlorothiazide (PRINZIDE,ZESTORETIC) 10-12.5 MG tablet    Sig: Take 1 tablet by mouth daily.    Dispense:  30 tablet    Refill:  3      Dr. Hayden [facility]  12/03/16

## 2016-12-03 NOTE — Patient Instructions (Addendum)
Why follow it? Research shows. . Those who follow the Mediterranean diet have a reduced risk of heart disease  . The diet is associated with a reduced incidence of Parkinson's and Alzheimer's diseases . People following the diet may have longer life expectancies and lower rates of chronic diseases  . The Dietary Guidelines for Americans recommends the Mediterranean diet as an eating plan to promote health and prevent disease  What Is the Mediterranean Diet?  . Healthy eating plan based on typical foods and recipes of Mediterranean-style cooking . The diet is primarily a plant based diet; these foods should make up a majority of meals   Starches - Plant based foods should make up a majority of meals - They are an important sources of vitamins, minerals, energy, antioxidants, and fiber - Choose whole grains, foods high in fiber and minimally processed items  - Typical grain sources include wheat, oats, barley, corn, brown rice, bulgar, farro, millet, polenta, couscous  - Various types of beans include chickpeas, lentils, fava beans, black beans, white beans   Fruits  Veggies - Large quantities of antioxidant rich fruits & veggies; 6 or more servings  - Vegetables can be eaten raw or lightly drizzled with oil and cooked  - Vegetables common to the traditional Mediterranean Diet include: artichokes, arugula, beets, broccoli, brussel sprouts, cabbage, carrots, celery, collard greens, cucumbers, eggplant, kale, leeks, lemons, lettuce, mushrooms, okra, onions, peas, peppers, potatoes, pumpkin, radishes, rutabaga, shallots, spinach, sweet potatoes, turnips, zucchini - Fruits common to the Mediterranean Diet include: apples, apricots, avocados, cherries, clementines, dates, figs, grapefruits, grapes, melons, nectarines, oranges, peaches, pears, pomegranates, strawberries, tangerines  Fats - Replace butter and margarine with healthy oils, such as olive oil, canola oil, and tahini  - Limit nuts to no  more than a handful a day  - Nuts include walnuts, almonds, pecans, pistachios, pine nuts  - Limit or avoid candied, honey roasted or heavily salted nuts - Olives are central to the Mediterranean diet - can be eaten whole or used in a variety of dishes   Meats Protein - Limiting red meat: no more than a few times a month - When eating red meat: choose lean cuts and keep the portion to the size of deck of cards - Eggs: approx. 0 to 4 times a week  - Fish and lean poultry: at least 2 a week  - Healthy protein sources include, chicken, turkey, lean beef, lamb - Increase intake of seafood such as tuna, salmon, trout, mackerel, shrimp, scallops - Avoid or limit high fat processed meats such as sausage and bacon  Dairy - Include moderate amounts of low fat dairy products  - Focus on healthy dairy such as fat free yogurt, skim milk, low or reduced fat cheese - Limit dairy products higher in fat such as whole or 2% milk, cheese, ice cream  Alcohol - Moderate amounts of red wine is ok  - No more than 5 oz daily for women (all ages) and men older than age 65  - No more than 10 oz of wine daily for men younger than 65  Other - Limit sweets and other desserts  - Use herbs and spices instead of salt to flavor foods  - Herbs and spices common to the traditional Mediterranean Diet include: basil, bay leaves, chives, cloves, cumin, fennel, garlic, lavender, marjoram, mint, oregano, parsley, pepper, rosemary, sage, savory, sumac, tarragon, thyme   It's not just a diet, it's a lifestyle:  . The Mediterranean diet includes   lifestyle factors typical of those in the region  . Foods, drinks and meals are best eaten with others and savored . Daily physical activity is important for overall good health . This could be strenuous exercise like running and aerobics . This could also be more leisurely activities such as walking, housework, yard-work, or taking the stairs . Moderation is the key; a balanced and  healthy diet accommodates most foods and drinks . Consider portion sizes and frequency of consumption of certain foods   Meal Ideas & Options:  . Breakfast:  o Whole wheat toast or whole wheat English muffins with peanut butter & hard boiled egg o Steel cut oats topped with apples & cinnamon and skim milk  o Fresh fruit: banana, strawberries, melon, berries, peaches  o Smoothies: strawberries, bananas, greek yogurt, peanut butter o Low fat greek yogurt with blueberries and granola  o Egg white omelet with spinach and mushrooms o Breakfast couscous: whole wheat couscous, apricots, skim milk, cranberries  . Sandwiches:  o Hummus and grilled vegetables (peppers, zucchini, squash) on whole wheat bread   o Grilled chicken on whole wheat pita with lettuce, tomatoes, cucumbers or tzatziki  o Tuna salad on whole wheat bread: tuna salad made with greek yogurt, olives, red peppers, capers, green onions o Garlic rosemary lamb pita: lamb sauted with garlic, rosemary, salt & pepper; add lettuce, cucumber, greek yogurt to pita - flavor with lemon juice and black pepper  . Seafood:  o Mediterranean grilled salmon, seasoned with garlic, basil, parsley, lemon juice and black pepper o Shrimp, lemon, and spinach whole-grain pasta salad made with low fat greek yogurt  o Seared scallops with lemon orzo  o Seared tuna steaks seasoned salt, pepper, coriander topped with tomato mixture of olives, tomatoes, olive oil, minced garlic, parsley, green onions and cappers  . Meats:  o Herbed greek chicken salad with kalamata olives, cucumber, feta  o Red bell peppers stuffed with spinach, bulgur, lean ground beef (or lentils) & topped with feta   o Kebabs: skewers of chicken, tomatoes, onions, zucchini, squash  o [place] burgers: made with red onions, mint, dill, lemon juice, feta cheese topped with roasted red peppers . Vegetarian o Cucumber salad: cucumbers, artichoke hearts, celery, red onion, feta cheese, tossed in  olive oil & lemon juice  o Hummus and whole grain pita points with a greek salad (lettuce, tomato, feta, olives, cucumbers, red onion) o Lentil soup with celery, carrots made with vegetable broth, garlic, salt and pepper  o Tabouli salad: parsley, bulgur, mint, scallions, cucumbers, tomato, radishes, lemon juice, olive oil, salt and pepper.     Mediterranean Diet A Mediterranean diet refers to food and lifestyle choices that are based on the traditions of countries located on the [employer]. This way of eating has been shown to help prevent certain conditions and improve outcomes for people who have chronic diseases, like kidney disease and heart disease. What are tips for following this plan? Lifestyle   Cook and eat meals together with your family, when possible.  Drink enough fluid to keep your urine clear or pale yellow.  Be physically active every day. This includes:  Aerobic exercise like running or swimming.  Leisure activities like gardening, walking, or housework.  Get 7-8 hours of sleep each night.  If recommended by your health care provider, drink red wine in moderation. This means 1 glass a day for nonpregnant women and 2 glasses a day for men. A glass of wine equals 5 oz (  150 mL). Reading food labels   Check the serving size of packaged foods. For foods such as rice and pasta, the serving size refers to the amount of cooked product, not dry.  Check the total fat in packaged foods. Avoid foods that have saturated fat or trans fats.  Check the ingredients list for added sugars, such as corn syrup. Shopping   At the grocery store, buy most of your food from the areas near the walls of the store. This includes:  Fresh fruits and vegetables (produce).  Grains, beans, nuts, and seeds. Some of these may be available in unpackaged forms or large amounts (in bulk).  Fresh seafood.  Poultry and eggs.  Low-fat dairy products.  Buy whole ingredients instead of  prepackaged foods.  Buy fresh fruits and vegetables in-season from local farmers markets.  Buy frozen fruits and vegetables in resealable bags.  If you do not have access to quality fresh seafood, buy precooked frozen shrimp or canned fish, such as tuna, salmon, or sardines.  Buy small amounts of raw or cooked vegetables, salads, or olives from the deli or salad bar at your store.  Stock your pantry so you always have certain foods on hand, such as olive oil, canned tuna, canned tomatoes, rice, pasta, and beans. Cooking   Cook foods with extra-virgin olive oil instead of using butter or other vegetable oils.  Have meat as a side dish, and have vegetables or grains as your main dish. This means having meat in small portions or adding small amounts of meat to foods like pasta or stew.  Use beans or vegetables instead of meat in common dishes like chili or lasagna.  Experiment with different cooking methods. Try roasting or broiling vegetables instead of steaming or sauteing them.  Add frozen vegetables to soups, stews, pasta, or rice.  Add nuts or seeds for added healthy fat at each meal. You can add these to yogurt, salads, or vegetable dishes.  Marinate fish or vegetables using olive oil, lemon juice, garlic, and fresh herbs. Meal planning   Plan to eat 1 vegetarian meal one day each week. Try to work up to 2 vegetarian meals, if possible.  Eat seafood 2 or more times a week.  Have healthy snacks readily available, such as:  Vegetable sticks with hummus.  Greek yogurt.  Fruit and nut trail mix.  Eat balanced meals throughout the week. This includes:  Fruit: 2-3 servings a day  Vegetables: 4-5 servings a day  Low-fat dairy: 2 servings a day  Fish, poultry, or lean meat: 1 serving a day  Beans and legumes: 2 or more servings a week  Nuts and seeds: 1-2 servings a day  Whole grains: 6-8 servings a day  Extra-virgin olive oil: 3-4 servings a day  Limit red meat  and sweets to only a few servings a month What are my food choices?  Mediterranean diet  Recommended  Grains: Whole-grain pasta. Brown rice. Bulgar wheat. Polenta. Couscous. Whole-wheat bread. Orpah Cobb.  Vegetables: Artichokes. Beets. Broccoli. Cabbage. Carrots. Eggplant. Green beans. Chard. Kale. Spinach. Onions. Leeks. Peas. Squash. Tomatoes. Peppers. Radishes.  Fruits: Apples. Apricots. Avocado. Berries. Bananas. Cherries. Dates. Figs. Grapes. Lemons. Melon. Oranges. Peaches. Plums. Pomegranate.  Meats and other protein foods: Beans. Almonds. Sunflower seeds. Pine nuts. Peanuts. Cod. Salmon. Scallops. Shrimp. Tuna. Tilapia. Clams. Oysters. Eggs.  Dairy: Low-fat milk. Cheese. Greek yogurt.  Beverages: Water. Red wine. Herbal tea.  Fats and oils: Extra virgin olive oil. Avocado oil. Grape seed oil.  Sweets and desserts: [place] yogurt with honey. Baked apples. Poached pears. Trail mix.  Seasoning and other foods: Basil. Cilantro. Coriander. Cumin. Mint. Parsley. Sage. Rosemary. Tarragon. Garlic. Oregano. Thyme. Pepper. Balsalmic vinegar. Tahini. Hummus. Tomato sauce. Olives. Mushrooms.  Limit these  Grains: Prepackaged pasta or rice dishes. Prepackaged cereal with added sugar.  Vegetables: Deep fried potatoes (french fries).  Fruits: Fruit canned in syrup.  Meats and other protein foods: Beef. Pork. Lamb. Poultry with skin. Hot dogs. Tomasa Blase.  Dairy: Ice cream. Sour cream. Whole milk.  Beverages: Juice. Sugar-sweetened soft drinks. Beer. Liquor and spirits.  Fats and oils: Butter. Canola oil. Vegetable oil. Beef fat (tallow). Lard.  Sweets and desserts: Cookies. Cakes. Pies. Candy.  Seasoning and other foods: Mayonnaise. Premade sauces and marinades.  The items listed may not be a complete list. Talk with your dietitian about what dietary choices are right for you. Summary  The Mediterranean diet includes both food and lifestyle choices.  Eat a variety of fresh  fruits and vegetables, beans, nuts, seeds, and whole grains.  Limit the amount of red meat and sweets that you eat.  Talk with your health care provider about whether it is safe for you to drink red wine in moderation. This means 1 glass a day for nonpregnant women and 2 glasses a day for men. A glass of wine equals 5 oz (150 mL). This information is not intended to replace advice given to you by your health care provider. Make sure you discuss any questions you have with your health care provider. Document Released: 03/19/2016 Document Revised: 04/21/2016 Document Reviewed: 03/19/2016 Elsevier Interactive Patient Education  2017 [employer] High Blood Pressure: Low-Sodium Diet   Many people find that cutting down on sodium lowers their blood pressure. A low-sodium diet limits the amount of sodium in your diet to no more than 2300 milligrams a day. One teaspoon of salt has about 2300 milligrams of sodium.   Our taste for salt is mainly a habit. When you gradually lower the amount of salt in your diet, your taste begins to change. After a while, food begins to taste better without salt than it did with it.   Dietary Recommendations   Table salt added to foods is a common source of sodium in the diet. By not adding salt to foods, you can reduce the amount of sodium in your diet. But sodium is also found in canned and prepared foods, even if they don't taste salty. Learn which foods to avoid by reading labels to find out how much sodium is in the foods. You can reduce the amount of sodium in your diet by following these guidelines:   Read labels carefully. Look for any form of sodium or salt, such as sodium benzoate or sodium citrate. Choose foods that have less salt.  Add very little or no salt to food that you prepare.  Check the sodium content when you use baking powder, baking soda, and monosodium glutamate (MSG).  Do not add salt to food at the table.  Fast foods are very high in salt, as  are many other restaurant foods. When you eat at a restaurant, try steamed fish and vegetables or fresh salads. Avoid soups.  Avoid eating the following foods:  ketchup, prepared mustard, pickles, and olives  soy sauce, steak or barbecue sauce, chili sauce, or Worcestershire sauce  bouillon cubes  commercially prepared or cured meats or fish (for example, bacon, luncheon meats, and canned sardines)  canned vegetables, soups, and  other packaged convenience foods  salty cheeses and buttermilk  salted nuts and peanut butter  self-rising flour and biscuit mixes  salted crackers, chips, popcorn, and pretzels  commercial salad dressings  instant cooked cereals.    Many of these foods are now available in unsalted or low-sodium versions. Read all labels carefully.  If your diet must be restricted to much lower amounts of sodium, talk to your health care provider and a registered dietitian for help in planning your meals. It is important to keep your meals nutritionally balanced and tasty. It can be hard to follow a restricted-salt diet if the food doesn't taste good, but there are many healthy ways to add taste without adding salt or fat.   Use of Salt Substitutes   Ask your health care provider about using salt substitutes. Most salt substitutes contain potassium for flavor. If you are taking certain medications, you may need to be careful about the amount of potassium in your diet.        Substitutions and Hints   Season foods with herbs and spices. Use onions, garlic, parsley, lemon and lime juice and rind, dill weed, basil, tarragon, marjoram, thyme, curry powder, turmeric, cumin, paprika, vinegar, or wine to enhance the flavor and aroma of foods. Mushrooms, celery, red pepper, yellow pepper, green pepper, and dried fruits also enhance specific dishes.  Eat fresh foods (instead of canned or packaged foods) as much as possible. Also, plain frozen fruits and vegetables usually do not have  added salt.  Add a pinch of sugar or a squeeze of lemon juice to bring out the flavor in fresh vegetables.  If you must use canned products, use the low-sodium types (except for fruit). Rinse canned vegetables with tap water before cooking.  Substitute unsalted, polyunsaturated margarine for regular margarine or butter.  Eat low-sodium cheeses. Many are available now, some with herbs and spices that are very tasty, and many are also low-fat.  Drink low-sodium juices.  Make unsalted or lightly salted soup stocks and keep them in the freezer to use as substitutes for canned broth and bouillon. Use these stocks to enhance vegetables.  Substitute wines and vinegars (especially the flavored vinegars) for salt to enhance flavors.  Eat tuna and salmon and rinse first with running water.  Use herbs such as bay leaf, curry, turmeric, cumin, cilantro, dill, marjoram, paprika, pepper, tarragon, thyme, sage, onions, or garlic to season chicken, beef, or fish.  Cook rice in homemade broth with mushrooms and scallions or shallots.  Help Yourself Become Healthier   Check food labels for sodium and fat content.  Read nutrition information available at your [employer], from the [employer], and through nutrition programs and health fairs. Ask your health care provider for printed information on nutrition, diet, and health.  Contact a dietitian for information.  Look for some of the excellent low-sodium cookbooks available in most bookstores.  Take time to plan and enjoy your meals. You will be pleasantly surprised at how fast you learn new food preparations, how lowering your sodium intake lowers your blood pressure, and how good food can be.

## 2016-12-30 ENCOUNTER — Other Ambulatory Visit: Payer: Self-pay | Admitting: Family Medicine

## 2016-12-30 DIAGNOSIS — Z8669 Personal history of other diseases of the nervous system and sense organs: Secondary | ICD-10-CM

## 2016-12-31 ENCOUNTER — Ambulatory Visit: Payer: Self-pay | Admitting: Family Medicine

## 2016-12-31 ENCOUNTER — Other Ambulatory Visit: Payer: Self-pay | Admitting: Family Medicine

## 2016-12-31 DIAGNOSIS — Z8669 Personal history of other diseases of the nervous system and sense organs: Secondary | ICD-10-CM

## 2017-01-14 ENCOUNTER — Encounter: Payer: Self-pay | Admitting: Family Medicine

## 2017-01-14 ENCOUNTER — Ambulatory Visit (INDEPENDENT_AMBULATORY_CARE_PROVIDER_SITE_OTHER): Payer: Self-pay | Admitting: Family Medicine

## 2017-01-14 VITALS — BP 110/70 | HR 80 | Ht 63.0 in | Wt 153.0 lb

## 2017-01-14 DIAGNOSIS — Z8669 Personal history of other diseases of the nervous system and sense organs: Secondary | ICD-10-CM

## 2017-01-14 DIAGNOSIS — I10 Essential (primary) hypertension: Secondary | ICD-10-CM

## 2017-01-14 DIAGNOSIS — G43909 Migraine, unspecified, not intractable, without status migrainosus: Secondary | ICD-10-CM

## 2017-01-14 MED ORDER — TOPIRAMATE 50 MG PO TABS: ORAL_TABLET | ORAL | 1 refills | Status: DC

## 2017-01-14 MED ORDER — LOSARTAN POTASSIUM-HCTZ 50-12.5 MG PO TABS
1.0000 | ORAL_TABLET | Freq: Every day | ORAL | 1 refills | Status: DC
Start: 2017-01-14 — End: 2018-09-12

## 2017-01-14 NOTE — Progress Notes (Signed)
Name: Allison Brock   MRN: [id number]    DOB: Jan 08, 1971   Date:01/14/2017       Progress Note  Subjective  Chief Complaint  Chief Complaint  Patient presents with  . Follow-up    started lisinopril on 12/03/16- has had cough at night    Hypertension  This is a new problem. The current episode started more than 1 month ago. The problem has been rapidly improving since onset. The problem is controlled. Associated symptoms include anxiety. Pertinent negatives include no blurred vision, chest pain, headaches, malaise/fatigue, neck pain, orthopnea, palpitations, peripheral edema, PND, shortness of breath or sweats. There are no associated agents to hypertension. There are no known risk factors for coronary artery disease. Past treatments include ACE inhibitors. The current treatment provides moderate improvement. There are no compliance problems.  There is no history of angina, kidney disease, CAD/MI, CVA, heart failure, left ventricular hypertrophy, PVD or retinopathy. There is no history of chronic renal disease, a hypertension causing med or renovascular disease.    No problem-specific Assessment & Plan notes found for this encounter.   Past Medical History:  Diagnosis Date  . Crohn disease (HCC)   . Seizures (HCC)   . Stroke [facility])     Past Surgical History:  Procedure Laterality Date  . CESAREAN SECTION      No family history on file.  Social History   Social History  . Marital status: Divorced    Spouse name: N/A  . Number of children: N/A  . Years of education: N/A   Occupational History  . Not on file.   Social History Main Topics  . Smoking status: Never Smoker  . Smokeless tobacco: Never Used  . Alcohol use No  . Drug use: No  . Sexual activity: Not on file   Other Topics Concern  . Not on file   Social History Narrative  . No narrative on file    Allergies  Allergen Reactions  . Ivp Dye [Iodinated Diagnostic Agents] Hives    Outpatient Medications  Prior to Visit  Medication Sig Dispense Refill  . lisinopril-hydrochlorothiazide (PRINZIDE,ZESTORETIC) 10-12.5 MG tablet Take 1 tablet by mouth daily. 30 tablet 3  . topiramate (TOPAMAX) 50 MG tablet Take 1/2-1 tablet twice a day for prevention of migraines 60 tablet 0  . ALPRAZolam (XANAX) 1 MG tablet Take 1 tablet by mouth as needed. Dr Omelia Blackwater    . HYDROcodone-acetaminophen (NORCO/VICODIN) 5-325 MG tablet Take 1 tablet by mouth every 6 (six) hours as needed for severe pain. 10 tablet 0  . QUEtiapine (SEROQUEL) 25 MG tablet Take 1 tablet (25 mg total) by mouth at bedtime. Use for treatment of migraines 6 tablet 0   No facility-administered medications prior to visit.     Review of Systems  Constitutional: Negative for chills, fever, malaise/fatigue and weight loss.  HENT: Negative for ear discharge, ear pain and sore throat.   Eyes: Negative for blurred vision.  Respiratory: Negative for cough, sputum production, shortness of breath and wheezing.   Cardiovascular: Negative for chest pain, palpitations, orthopnea, leg swelling and PND.  Gastrointestinal: Negative for abdominal pain, blood in stool, constipation, diarrhea, heartburn, melena and nausea.  Genitourinary: Negative for dysuria, frequency, hematuria and urgency.  Musculoskeletal: Negative for back pain, joint pain, myalgias and neck pain.  Skin: Negative for rash.  Neurological: Negative for dizziness, tingling, sensory change, focal weakness and headaches.  Endo/Heme/Allergies: Negative for environmental allergies and polydipsia. Does not bruise/bleed easily.  Psychiatric/Behavioral: Negative for  depression and suicidal ideas. The patient is not nervous/anxious and does not have insomnia.      Objective  Vitals:   01/14/17 1034  BP: 110/70  Pulse: 80  Weight: 153 lb (69.4 kg)  Height: 5\' 3"  (1.6 m)    Physical Exam  Constitutional: She is well-developed, well-nourished, and in no distress. No distress.  HENT:  Head:  Normocephalic and atraumatic.  Right Ear: External ear normal.  Left Ear: External ear normal.  Nose: Nose normal.  Mouth/Throat: Oropharynx is clear and moist.  Eyes: Conjunctivae and EOM are normal. Pupils are equal, round, and reactive to light. Right eye exhibits no discharge. Left eye exhibits no discharge.  Neck: Normal range of motion. Neck supple. No JVD present. No thyromegaly present.  Cardiovascular: Normal rate, regular rhythm, normal heart sounds and intact distal pulses.  Exam reveals no gallop and no friction rub.   No murmur heard. Pulmonary/Chest: Effort normal and breath sounds normal. She has no wheezes. She has no rales.  Abdominal: Soft. Bowel sounds are normal. She exhibits no mass. There is no tenderness. There is no guarding.  Musculoskeletal: Normal range of motion. She exhibits no edema.  Lymphadenopathy:    She has no cervical adenopathy.  Neurological: She is alert. She has normal reflexes.  Skin: Skin is warm and dry. She is not diaphoretic.  Psychiatric: Mood and affect normal.  Nursing note and vitals reviewed.     Assessment & Plan  Problem List Items Addressed This Visit    None    Visit Diagnoses    Essential hypertension    -  Primary   Relevant Medications   losartan-hydrochlorothiazide (HYZAAR) 50-12.5 MG tablet   Migraine without status migrainosus, not intractable, unspecified migraine type       Relevant Medications   losartan-hydrochlorothiazide (HYZAAR) 50-12.5 MG tablet   topiramate (TOPAMAX) 50 MG tablet   Hx of migraines       referral [phone]   Relevant Medications   topiramate (TOPAMAX) 50 MG tablet      Meds ordered this encounter  Medications  . losartan-hydrochlorothiazide (HYZAAR) 50-12.5 MG tablet    Sig: Take 1 tablet by mouth daily.    Dispense:  90 tablet    Refill:  1  . topiramate (TOPAMAX) 50 MG tablet    Sig: Take 1/2-1 tablet twice a day for prevention of migraines    Dispense:  180  tablet    Refill:  1      Dr. Hayden Rasmusseneanna Rasheed Welty [facility]  01/14/17

## 2017-09-07 ENCOUNTER — Ambulatory Visit (INDEPENDENT_AMBULATORY_CARE_PROVIDER_SITE_OTHER): Payer: BLUE CROSS/BLUE SHIELD

## 2017-09-07 ENCOUNTER — Other Ambulatory Visit: Payer: Self-pay

## 2017-09-07 ENCOUNTER — Ambulatory Visit
Admission: EM | Admit: 2017-09-07 | Discharge: 2017-09-07 | Disposition: A | Discharge status: Home / Self Care | Payer: BLUE CROSS/BLUE SHIELD | Attending: Emergency Medicine | Admitting: Emergency Medicine

## 2017-09-07 DIAGNOSIS — J0110 Acute frontal sinusitis, unspecified: Secondary | ICD-10-CM

## 2017-09-07 DIAGNOSIS — J069 Acute upper respiratory infection, unspecified: Secondary | ICD-10-CM

## 2017-09-07 MED ORDER — BENZONATATE 200 MG PO CAPS: 200.0000 mg | ORAL_CAPSULE | Freq: Three times a day (TID) | ORAL | 0 refills | Status: DC | PRN

## 2017-09-07 MED ORDER — FLUTICASONE PROPIONATE 50 MCG/ACT NA SUSP: 2.0000 | Freq: Every day | NASAL | 0 refills | Status: DC

## 2017-09-07 MED ORDER — HYDROCOD POLST-CPM POLST ER 10-8 MG/5ML PO SUER: 5.0000 mL | Freq: Two times a day (BID) | ORAL | 0 refills | Status: DC | PRN

## 2017-09-07 MED ORDER — DOXYCYCLINE HYCLATE 100 MG PO CAPS: 100.0000 mg | ORAL_CAPSULE | Freq: Two times a day (BID) | ORAL | 0 refills | Status: AC

## 2017-09-07 MED ORDER — ALBUTEROL SULFATE HFA 108 (90 BASE) MCG/ACT IN AERS: 1.0000 | INHALATION_SPRAY | Freq: Four times a day (QID) | RESPIRATORY_TRACT | 0 refills | Status: DC | PRN

## 2017-09-07 MED ORDER — AEROCHAMBER PLUS MISC: 2 refills | Status: DC

## 2017-09-07 NOTE — ED Provider Notes (Signed)
HPI  SUBJECTIVE:  Allison Brock is a 47 y.o. female who presents with postnasal drip, cough productive of thick greenish mucus, fevers 101.8 for 5 days.  She reports wheezing at night.  No other wheezing.  She reports shortness of breath secondary to the cough, but no other shortness of breath, chest pain or shortness of breath with exertion.  She reports constant, sharp, right ear pain starting 4 days ago.  She denies a change in hearing, otorrhea.  She also reports a frontal headache, sore, scratchy throat.  States that she is unable to sleep at night secondary to cough.  She tried Tylenol and NyQuil.  The Tylenol helps.  No aggravating factors.  She denies nasal congestion, rhinorrhea, sinus pain or pressure, upper dental pain.  No chest pain, dyspnea on exertion.  No recent swimming.  Treated about a month ago for otitis media with a TM perforation.  She has a past medical history of pneumonia, Crohn's, right otitis media with perforation, migraines, hypertension which she takes lisinopril.  No history of diabetes, asthma, eczema, COPD, smoking.  LMP: 4-1/2 weeks ago.  She is irregular.  She denies the possibility of being pregnant.  PMD: Duanne LimerickJones, Deanna C, MD  Past Medical History:  Diagnosis Date  . Crohn disease (HCC)   . Seizures (HCC)   . Stroke [facility](HCC)     Past Surgical History:  Procedure Laterality Date  . CESAREAN SECTION      Family History  Problem Relation Age of Onset  . Heart attack Father     Social History   Tobacco Use  . Smoking status: Never Smoker  . Smokeless tobacco: Never Used  Substance Use Topics  . Alcohol use: No  . Drug use: No    No current facility-administered medications for this encounter.   Current Outpatient Medications:  .  losartan-hydrochlorothiazide (HYZAAR) 50-12.5 MG tablet, Take 1 tablet by mouth daily., Disp: 90 tablet, Rfl: 1 .  topiramate (TOPAMAX) 50 MG tablet, Take 1/2-1 tablet twice a day for prevention of migraines, Disp: 180  tablet, Rfl: 1  Allergies  Allergen Reactions  . Ivp Dye [Iodinated Diagnostic Agents] Hives     ROS  As noted in HPI.   Physical Exam  BP (!) 138/91 (BP Location: Left Arm)   Pulse 62   Temp 98.8 F (37.1 C) (Oral)   Resp 18   Ht 5\' 3"  (1.6 m)   Wt 155 lb (70.3 kg)   LMP 07/27/2017 Comment: denies preg  SpO2 100%   BMI 27.46 kg/m   Constitutional: Well developed, well nourished, no acute distress Eyes:  EOMI, conjunctiva normal bilaterally HENT: Normocephalic, atraumatic,mucus membranes moist.  Positive mild nasal congestion.  Positive frontal sinus tenderness, no maxillary sinus tenderness.  Positive cobblestoning.  No obvious postnasal drip. Left TM normal. Right external ear canal and external ear normal.  Positive mild pain with traction on the pinna and palpation of tragus.  No mastoid tenderness.  Positive tenderness over the right TMJ.  No crepitus.  TMs normal. Neck: Positive shotty cervical tender lymphadenopathy Respiratory: Normal inspiratory effort, lungs clear bilaterally, good air movement.  Positive chest wall tenderness Cardiovascular: Normal rate, regular rhythm, no murmurs rubs or gallops  GI: nondistended skin: No rash, skin intact Musculoskeletal: no deformities Neurologic: Alert & oriented x 3, no focal neuro deficits Psychiatric: Speech and behavior appropriate   ED Course   Medications - No data to display  Orders Placed This Encounter  Procedures  . DG  Chest 2 View    Standing Status:   Standing    Number of Occurrences:   1    Order Specific Question:   Reason for Exam (SYMPTOM  OR DIAGNOSIS REQUIRED)    Answer:   r/o PNA    No results found for this or any previous visit (from the past 24 hour(s)). Dg Chest 2 View  Result Date: 09/07/2017 CLINICAL DATA:  Onset of malaise 4 days ago associated with productive cough and fever and headaches. History of previous pneumonia, CVA. EXAM: CHEST  2 VIEW COMPARISON:  Portable chest x-ray of  May 13, 2011 FINDINGS: The lungs are well-expanded. There is no focal infiltrate. There is no pleural effusion. The heart and pulmonary vascularity are normal. The mediastinum is normal in width. The bony thorax exhibits no acute abnormality. IMPRESSION: There is no pneumonia nor other acute cardiopulmonary abnormality. Electronically Signed   By: David  [place] M.D.   On: 09/07/2017 09:32    ED Clinical Impression  Upper respiratory tract infection, unspecified type  Acute non-recurrent frontal sinusitis   ED Assessment/Plan   Narcotic database reviewed for this patient, and feel that the risk/benefit ratio today is favorable for proceeding with a prescription for controlled substance.  No opiate prescriptions since 2017.  Concern for pneumonia given fever and cough.  Will check chest x-ray and reevaluate.    Reviewed imaging independently.  No pneumonia.  See radiology report for full details.  Feel that the otalgia is coming from a sore throat, although could be temporomandibular joint arthralgia.  She does not have any evidence of otitis externa, otitis media.  Her chest x-ray is negative for pneumonia, but she has had fevers for 5 days.  She does have some frontal sinus tenderness which is concerning for sinusitis.  Will send home with Tussionex, Tessalon, doxycycline which will also cover pneumonia.  Albuterol inhaler with a spacer in case she starts having chest tightness or consistent wheezing.  Continue Tylenol as needed.  We will also send home with Flonase for the postnasal drip.  Follow up with her primary care physician as needed, to the ER if she gets worse.  Discussed  imaging, MDM, plan and followup with patient. Discussed sn/sx that should prompt return to the ED. patient agrees with plan.   No orders of the defined types were placed in this encounter.   *This clinic note was created using Dragon dictation software. Therefore, there may be occasional mistakes despite  careful proofreading.   ?   Domenick Gong, MD 09/07/17 1806

## 2017-09-07 NOTE — Discharge Instructions (Signed)
Tessalon Perles during the day, Tussionex for the cough at night.  You may try 400-600 mg of ibuprofen with 1 g of Tylenol 3-4 times a day as needed for pain, fever, chest wall soreness, body aches.  2 puffs from your albuterol inhaler using a spacer every 4-6 hours as needed for coughing, wheezing, chest tightness.

## 2017-09-07 NOTE — ED Triage Notes (Signed)
Patient complains of cough, congestion, ear pain-right, body aches, and fever that started Thursday pm.

## 2018-09-12 ENCOUNTER — Ambulatory Visit: Payer: 59 | Admitting: Family Medicine

## 2018-09-12 ENCOUNTER — Encounter: Payer: Self-pay | Admitting: Family Medicine

## 2018-09-12 VITALS — BP 140/90 | HR 76 | Ht 63.0 in | Wt 152.0 lb

## 2018-09-12 DIAGNOSIS — H00014 Hordeolum externum left upper eyelid: Secondary | ICD-10-CM

## 2018-09-12 DIAGNOSIS — B0229 Other postherpetic nervous system involvement: Secondary | ICD-10-CM

## 2018-09-12 DIAGNOSIS — R030 Elevated blood-pressure reading, without diagnosis of hypertension: Secondary | ICD-10-CM

## 2018-09-12 DIAGNOSIS — Z8619 Personal history of other infectious and parasitic diseases: Secondary | ICD-10-CM

## 2018-09-12 MED ORDER — VALACYCLOVIR HCL 500 MG PO TABS: 500.0000 mg | ORAL_TABLET | Freq: Two times a day (BID) | ORAL | 0 refills | Status: DC

## 2018-09-12 MED ORDER — SULFACETAMIDE SODIUM 10 % OP SOLN: 1.0000 [drp] | OPHTHALMIC | 0 refills | Status: DC

## 2018-09-12 MED ORDER — GABAPENTIN 100 MG PO CAPS: 100.0000 mg | ORAL_CAPSULE | Freq: Three times a day (TID) | ORAL | 3 refills | Status: DC

## 2018-09-12 NOTE — Progress Notes (Signed)
Date:  09/12/2018   Name:  Allison Brock   DOB:  [birth year]   MRN:  [id number]   Chief Complaint: Rash (burning on the left side of chest- finished med on 09/01/2018- feels like "it is coming back")  Rash  This is a chronic problem. The current episode started more than 1 month ago. The problem has been waxing and waning since onset. The affected locations include the chest. The rash is characterized by blistering (with pain). It is unknown if there was an exposure to a precipitant. Pertinent negatives include no congestion, cough, diarrhea, eye pain, facial edema, fatigue, fever, joint pain, nail changes, rhinorrhea, shortness of breath, sore throat or vomiting. Treatments tried: anti viral/ gabapentin. The treatment provided no relief.    Review of Systems  Constitutional: Negative.  Negative for chills, fatigue, fever and unexpected weight change.  HENT: Negative for congestion, ear discharge, ear pain, rhinorrhea, sinus pressure, sneezing and sore throat.   Eyes: Negative for photophobia, pain, discharge, redness and itching.  Respiratory: Negative for cough, shortness of breath, wheezing and stridor.   Gastrointestinal: Negative for abdominal pain, blood in stool, constipation, diarrhea, nausea and vomiting.  Endocrine: Negative for cold intolerance, heat intolerance, polydipsia, polyphagia and polyuria.  Genitourinary: Negative for dysuria, flank pain, frequency, hematuria, menstrual problem, pelvic pain, urgency, vaginal bleeding and vaginal discharge.  Musculoskeletal: Negative for arthralgias, back pain, joint pain and myalgias.  Skin: Positive for rash. Negative for nail changes.  Allergic/Immunologic: Negative for environmental allergies and food allergies.  Neurological: Negative for dizziness, weakness, light-headedness, numbness and headaches.  Hematological: Negative for adenopathy. Does not bruise/bleed easily.  Psychiatric/Behavioral: Negative for dysphoric mood. The  patient is not nervous/anxious.     There are no active problems to display for this patient.   Allergies  Allergen Reactions  . Ivp Dye [Iodinated Diagnostic Agents] Hives    Past Surgical History:  Procedure Laterality Date  . CESAREAN SECTION      Social History   Tobacco Use  . Smoking status: Never Smoker  . Smokeless tobacco: Never Used  Substance Use Topics  . Alcohol use: No  . Drug use: No     Medication list has been reviewed and updated.  No outpatient medications have been marked as taking for the 09/12/18 encounter (Office Visit) with Duanne Limerick, MD.    No flowsheet data found.  Physical Exam Vitals signs and nursing note reviewed.  Constitutional:      General: She is not in acute distress.    Appearance: She is not diaphoretic.  HENT:     Head: Normocephalic and atraumatic.     Right Ear: External ear normal.     Left Ear: External ear normal.     Nose: Nose normal.  Eyes:     General: Lids are normal. Lids are everted, no foreign bodies appreciated.        Right eye: No discharge or hordeolum.        Left eye: No discharge or hordeolum.     Conjunctiva/sclera:     Right eye: Right conjunctiva is not injected. No chemosis, exudate or hemorrhage.    Left eye: Left conjunctiva is injected. No chemosis, exudate or hemorrhage.    Pupils: Pupils are equal, round, and reactive to light.     Comments: Left lateral upper lid  Neck:     Musculoskeletal: Normal range of motion and neck supple.     Thyroid: No thyromegaly.  Vascular: No JVD.  Cardiovascular:     Rate and Rhythm: Normal rate and regular rhythm.     Heart sounds: Normal heart sounds. No murmur. No friction rub. No gallop.   Pulmonary:     Effort: Pulmonary effort is normal.     Breath sounds: Normal breath sounds.  Abdominal:     General: Bowel sounds are normal.     Palpations: Abdomen is soft. There is no mass.     Tenderness: There is no abdominal tenderness. There is no  guarding.  Musculoskeletal: Normal range of motion.  Lymphadenopathy:     Cervical: No cervical adenopathy.  Skin:    General: Skin is warm and dry.     Findings: Rash is not crusting, macular, nodular, papular, purpuric, pustular, scaling, urticarial or vesicular.     Comments: No rash remains  Neurological:     Mental Status: She is alert.     Cranial Nerves: Cranial nerves are intact. No cranial nerve deficit or facial asymmetry.     Sensory: Sensation is intact.     Motor: Motor function is intact.     Deep Tendon Reflexes: Reflexes are normal and symmetric.     BP 140/90   Pulse 76   Ht 5\' 3"  (1.6 m)   Wt 152 lb (68.9 kg)   BMI 26.93 kg/m   Assessment and Plan:  1. History of shingles Patient has had previous episodes of zoster which she is been treated but apparently had a recurrence last month with persistence of pain.  Patient was put on suppressant therapy of valacyclovir 500 mg twice a day.  Furl to dermatology because of occurrence of zoster for evaluation and treatment. - Ambulatory referral to Dermatology  2. Postherpetic neuralgia Has continued to have pain in the area over the past several weeks.  This is now increasing over the past couple days.  This is been controlled with gabapentin in the past 100 mg 3 times a day and with this will be resumed. - Ambulatory referral to Dermatology  3. Hordeolum externum of left upper eyelid Patient has an irritation described as a "stye ".  The lateral aspect of the left eye has some mild erythema and we will use some drops for the next 5 days.   4. Elevated blood pressure reading Patient has had elevated readings on multiple occasions which she attributes to anxiety to professional offices.  Prefers not to resume antihypertensive medications and for patient was instructed on avoidance of excess sodium.

## 2018-09-12 NOTE — Patient Instructions (Addendum)
Postherpetic Neuralgia Postherpetic neuralgia (PHN) is nerve pain that occurs after a shingles infection. Shingles is a painful rash that appears on one area of the body, usually on the trunk or face. Shingles is caused by the varicella-zoster virus. This is the same virus that causes chickenpox. In people who have had chickenpox, the virus can resurface years later and cause shingles. You may have PHN if you continue to have pain for 4 months after your shingles rash has gone away. PHN appears in the same area where you had the shingles rash. The pain usually goes away after the rash disappears. Getting a vaccination for shingles can prevent PHN. This vaccine is recommended for people older than 60. It may prevent shingles, and may also lower your risk of PHN if you do get shingles. What are the causes? This condition is caused by damage to your nerves from the varicella-zoster virus. The damage makes your nerves overly sensitive. What increases the risk? The following factors may make you more likely to develop this condition:  Being older than 47 years of age.  Having severe pain before your shingles rash starts.  Having a severe rash.  Having shingles in and around the eye area.  Having a disease that makes your body unable to fight infections (weak immune system). What are the signs or symptoms? The main symptom of this condition is pain. The pain may:  Often be very bad and may be described as stabbing, burning, or feeling like an electric shock.  Come and go or may be there all the time.  Be triggered by light touches on the skin or changes in temperature. You may have itching along with the pain. How is this diagnosed? This condition may be diagnosed based on your symptoms and your history of shingles. Lab studies and other diagnostic tests are usually not needed. How is this treated? There is no cure for this condition. Treatment for PHN will focus on pain relief.  Over-the-counter pain relievers do not usually relieve PHN pain. You may need to work with a pain specialist. Treatment may include:  Antidepressant medicines to help with pain and improve sleep.  Anti-seizure medicines to relieve nerve pain.  Strong pain relievers (opioids).  A numbing patch worn on the skin (lidocaine patch).  Botox (botulinum toxin) injections to block pain signals between nerves and muscles.  Injections of numbing medicine or anti-inflammatory medicines around irritated nerves. Follow these instructions at home:   It may take a long time to recover from PHN. Work closely with your health care provider and develop a good support system at home.  Take over-the-counter and prescription medicines only as told by your health care provider.  Do not drive or use heavy machinery while taking prescription pain medicine.  Wear loose, comfortable clothing.  Cover sensitive areas with a dressing to reduce friction from clothing rubbing on the area.  If directed, put ice on the painful area: ? Put ice in a plastic bag. ? Place a towel between your skin and the bag. ? Leave the ice on for 20 minutes, 2-3 times a day.  Talk to your health care provider if you feel depressed or desperate. Living with long-term pain can be depressing.  Keep all follow-up visits as told by your health care provider. This is important. Contact a health care provider if:  Your medicine is not helping.  You are struggling to manage your pain at home. Summary  Postherpetic neuralgia is a very painful disorder   that can occur after an episode of shingles.  The pain is often severe, burning, electric, or stabbing.  Prescription medicines can be helpful in managing persistent pain.  Getting a vaccination for shingles can prevent PHN. This vaccine is recommended for people older than 60. This information is not intended to replace advice given to you by your health care provider. Make sure  you discuss any questions you have with your health care provider. Document Released: 10/17/2002 Document Revised: 10/13/2016 Document Reviewed: 10/13/2016 Elsevier Interactive Patient Education  2019 [employer] Inc[employer]  Shingles  Shingles, which is also known as herpes zoster, is an infection that causes a painful skin rash and fluid-filled blisters. It is caused by a virus. Shingles only develops in people who:  Have had chickenpox.  Have been given a medicine to protect against chickenpox (have been vaccinated). Shingles is rare in this group. What are the causes? Shingles is caused by varicella-zoster virus (VZV). This is the same virus that causes chickenpox. After a person is exposed to VZV, the virus stays in the body in an inactive (dormant) state. Shingles develops if the virus is reactivated. This can happen many years after the first (initial) exposure to VZV. It is not known what causes this virus to be reactivated. What increases the risk? People who have had chickenpox or received the chickenpox vaccine are at risk for shingles. Shingles infection is more common in people who:  Are older than age 19.  Have a weakened disease-fighting system (immune system), such as people with: ? HIV. ? AIDS. ? Cancer.  Are taking medicines that weaken the immune system, such as transplant medicines.  Are experiencing a lot of stress. What are the signs or symptoms? Early symptoms of this condition include itching, tingling, and pain in an area on your skin. Pain may be described as burning, stabbing, or throbbing. A few days or weeks after early symptoms start, a painful red rash appears. The rash is usually on one side of the body and has a band-like or belt-like pattern. The rash eventually turns into fluid-filled blisters that break open, change into scabs, and dry up in about 2-3 weeks. At any time during the infection, you may also develop:  A fever.  Chills.  A headache.  An  upset stomach. How is this diagnosed? This condition is diagnosed with a skin exam. Skin or fluid samples may be taken from the blisters before a diagnosis is made. These samples are examined under a microscope or sent to a lab for testing. How is this treated? The rash may last for several weeks. There is not a specific cure for this condition. Your health care provider will probably prescribe medicines to help you manage pain, recover more quickly, and avoid long-term problems. Medicines may include:  Antiviral drugs.  Anti-inflammatory drugs.  Pain medicines.  Anti-itching medicines (antihistamines). If the area involved is on your face, you may be referred to a specialist, such as an eye doctor (ophthalmologist) or an ear, nose, and throat (ENT) doctor (otolaryngologist) to help you avoid eye problems, chronic pain, or disability. Follow these instructions at home: Medicines  Take over-the-counter and prescription medicines only as told by your health care provider.  Apply an anti-itch cream or numbing cream to the affected area as told by your health care provider. Relieving itching and discomfort   Apply cold, wet cloths (cold compresses) to the area of the rash or blisters as told by your health care provider.  Cool baths  can be soothing. Try adding baking soda or dry oatmeal to the water to reduce itching. Do not bathe in hot water. Blister and rash care  Keep your rash covered with a loose bandage (dressing). Wear loose-fitting clothing to help ease the pain of material rubbing against the rash.  Keep your rash and blisters clean by washing the area with mild soap and cool water as told by your health care provider.  Check your rash every day for signs of infection. Check for: ? More redness, swelling, or pain. ? Fluid or blood. ? Warmth. ? Pus or a bad smell.  Do not scratch your rash or pick at your blisters. To help avoid scratching: ? Keep your fingernails clean  and cut short. ? Wear gloves or mittens while you sleep, if scratching is a problem. General instructions  Rest as told by your health care provider.  Keep all follow-up visits as told by your health care provider. This is important.  Wash your hands often with soap and water. If soap and water are not available, use hand sanitizer. Doing this lowers your chance of getting a bacterial skin infection.  Before your blisters change into scabs, your shingles infection can cause chickenpox in people who have never had it or have never been vaccinated against it. To prevent this from happening, avoid contact with other people, especially: ? Babies. ? Pregnant women. ? Children who have eczema. ? Elderly people who have transplants. ? People who have chronic illnesses, such as cancer or AIDS. Contact a health care provider if:  Your pain is not relieved with prescribed medicines.  Your pain does not get better after the rash heals.  You have signs of infection in the rash area, such as: ? More redness, swelling, or pain around the rash. ? Fluid or blood coming from the rash. ? The rash area feeling warm to the touch. ? Pus or a bad smell coming from the rash. Get help right away if:  The rash is on your face or nose.  You have facial pain, pain around your eye area, or loss of feeling on one side of your face.  You have difficulty seeing.  You have ear pain or have ringing in your ear.  You have a loss of taste.  Your condition gets worse. Summary  Shingles, which is also known as herpes zoster, is an infection that causes a painful skin rash and fluid-filled blisters.  This condition is diagnosed with a skin exam. Skin or fluid samples may be taken from the blisters and examined before the diagnosis is made.  Keep your rash covered with a loose bandage (dressing). Wear loose-fitting clothing to help ease the pain of material rubbing against the rash.  Before your blisters  change into scabs, your shingles infection can cause chickenpox in people who have never had it or have never been vaccinated against it. This information is not intended to replace advice given to you by your health care provider. Make sure you discuss any questions you have with your health care provider. Document Released: 07/27/2005 Document Revised: 03/31/2017 Document Reviewed: 03/31/2017 Elsevier Interactive Patient Education  2019 [employer]

## 2019-02-11 IMAGING — CR DG CHEST 2V
2 series · 2 of 2 positions shown · non-contrast
Comparison: Portable chest x-ray May 13, 2011

CLINICAL DATA: Onset of malaise 4 days ago associated with
productive cough and fever and headaches. History of previous
pneumonia, CVA.

EXAM:
CHEST  2 VIEW

[chest pa]
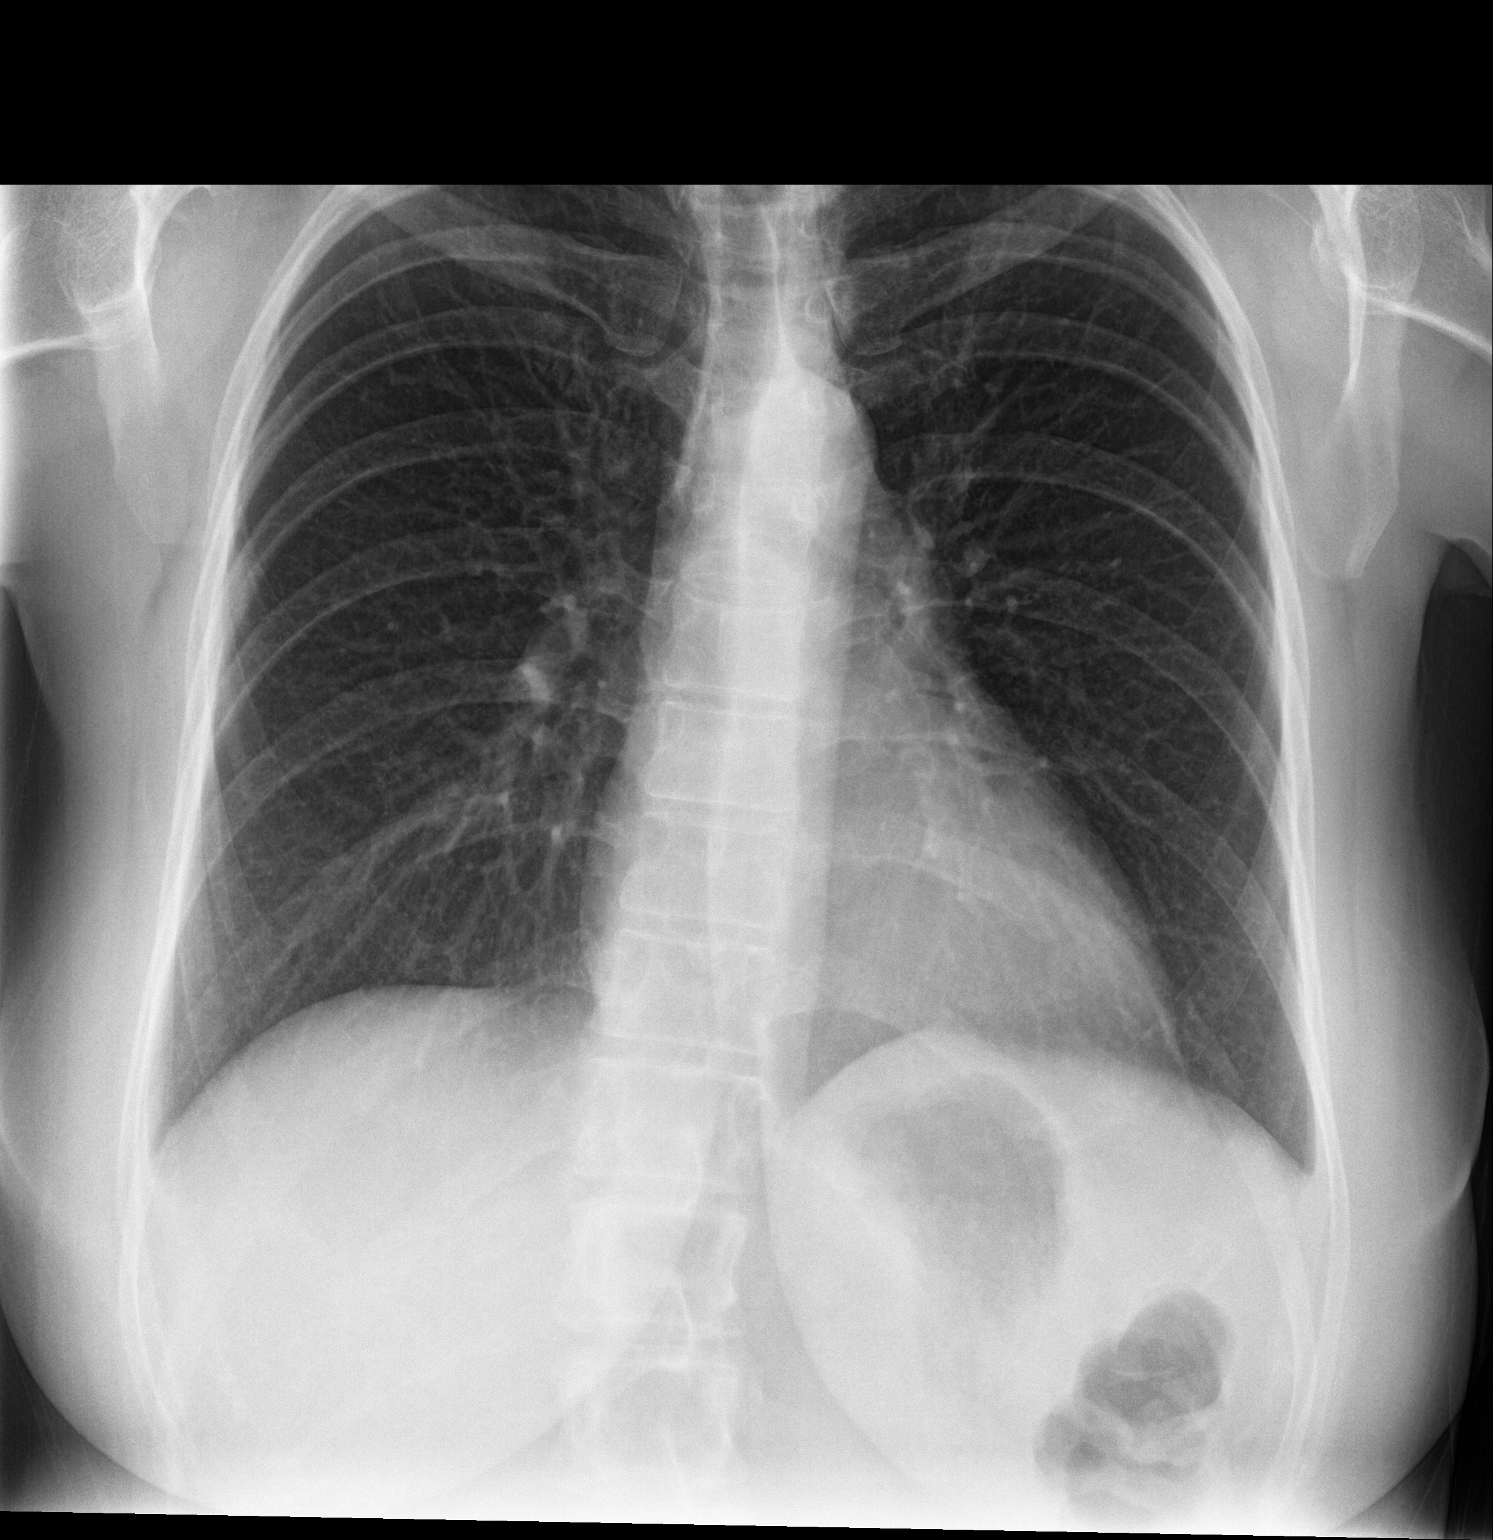

[chest lat]
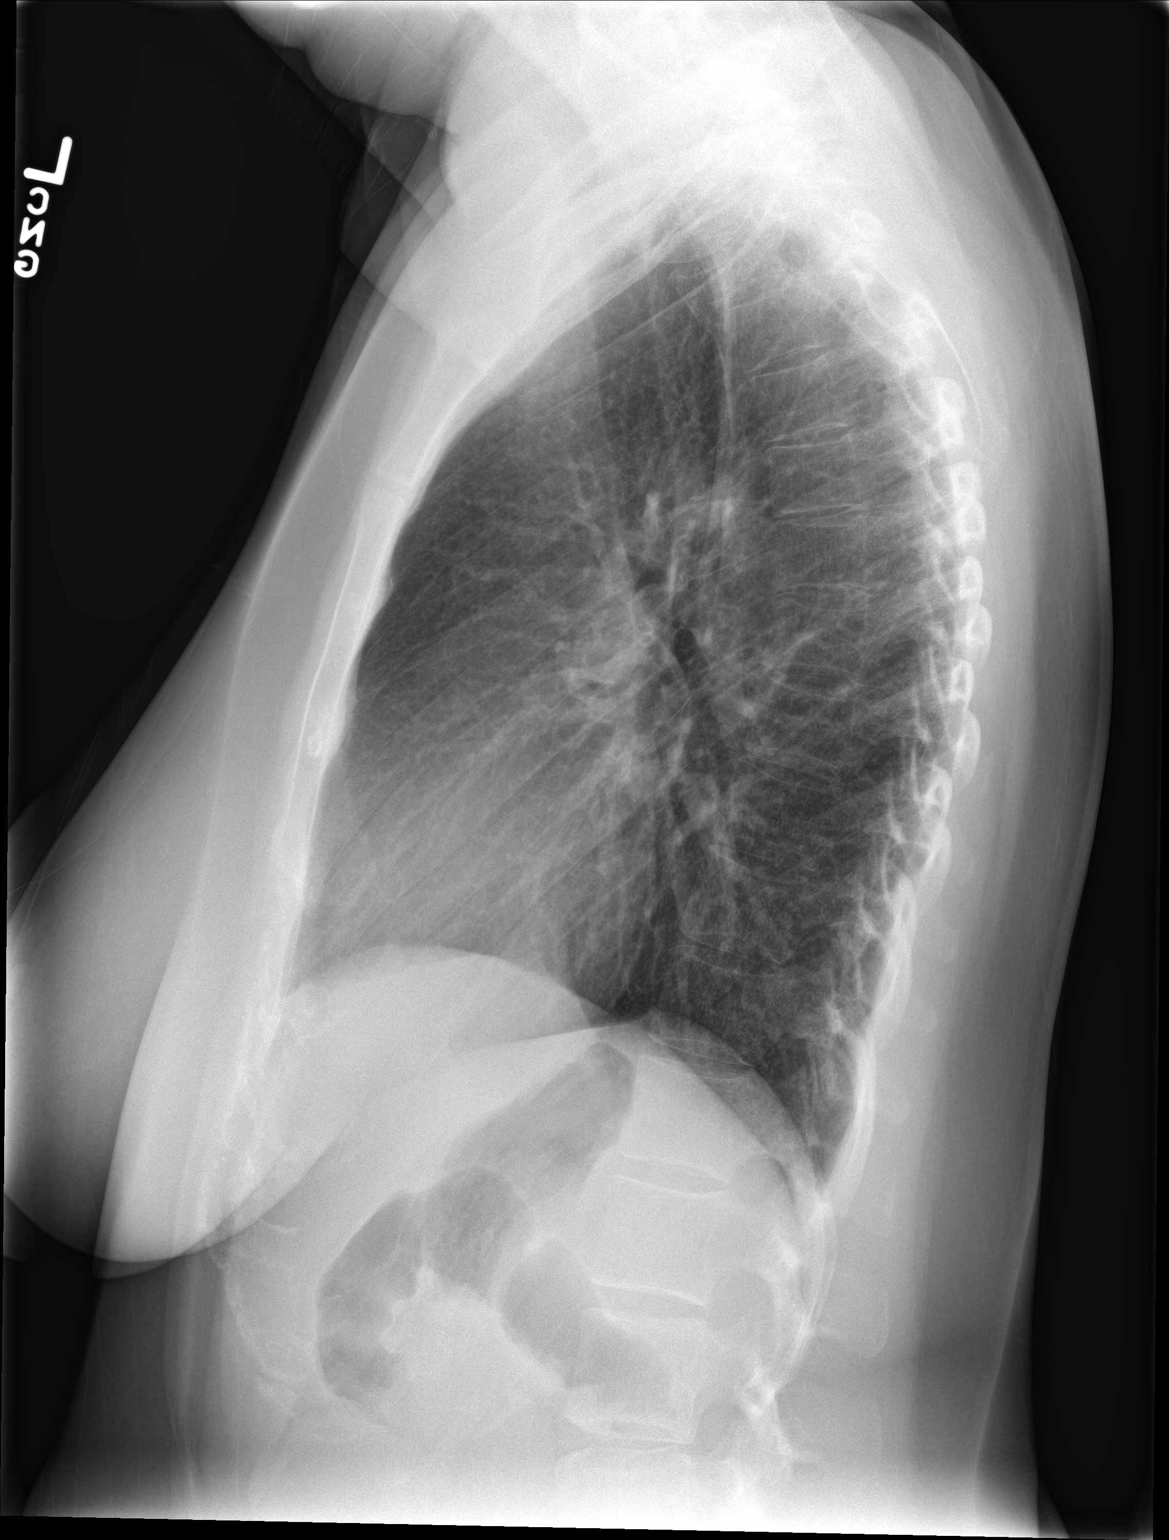

[2 of 2 positions shown; findings below may reference images not displayed]

FINDINGS: The lungs are well-expanded. There is no focal infiltrate. There is
no pleural effusion. The heart and pulmonary vascularity are normal.
The mediastinum is normal in width. The bony thorax exhibits no
acute abnormality.
IMPRESSION: There is no pneumonia nor other acute cardiopulmonary abnormality.

## 2019-06-20 ENCOUNTER — Encounter: Payer: Self-pay | Admitting: Emergency Medicine

## 2019-06-20 ENCOUNTER — Ambulatory Visit
Admission: EM | Admit: 2019-06-20 | Discharge: 2019-06-20 | Disposition: A | Discharge status: Home / Self Care | Payer: 59 | Attending: Emergency Medicine | Admitting: Emergency Medicine

## 2019-06-20 ENCOUNTER — Other Ambulatory Visit: Payer: Self-pay

## 2019-06-20 DIAGNOSIS — M26622 Arthralgia of left temporomandibular joint: Secondary | ICD-10-CM

## 2019-06-20 DIAGNOSIS — J029 Acute pharyngitis, unspecified: Secondary | ICD-10-CM | POA: Diagnosis not present

## 2019-06-20 DIAGNOSIS — H6692 Otitis media, unspecified, left ear: Secondary | ICD-10-CM

## 2019-06-20 DIAGNOSIS — Z7189 Other specified counseling: Secondary | ICD-10-CM

## 2019-06-20 LAB — RAPID STREP SCREEN (MED CTR MEBANE ONLY): Streptococcus, Group A Screen (Direct): NEGATIVE

## 2019-06-20 MED ORDER — AMOXICILLIN 875 MG PO TABS: 875.0000 mg | ORAL_TABLET | Freq: Two times a day (BID) | ORAL | 0 refills | Status: DC

## 2019-06-20 NOTE — ED Triage Notes (Signed)
Pt c/o left ear pain, fullness, headache and sore throat. Started last night. Denies fever, nasal congestion, or cough.

## 2019-06-20 NOTE — Discharge Instructions (Addendum)
Take medication as prescribed. Rest. Drink plenty of fluids.  ° °Follow up with your primary care physician this week as needed. Return to Urgent care for new or worsening concerns.  ° °

## 2019-06-20 NOTE — ED Provider Notes (Signed)
[facility] ____________________________________________  Time seen: Approximately 9:33 AM  I have reviewed the triage vital signs and the nursing notes.   HISTORY  Chief Complaint Otalgia (left) and Sore Throat  HPI Allison Brock is a 48 y.o. female presenting for evaluation of left ear pain and left-sided sore throat since last night.  States ear pain feels throbbing and aching.  Sore throat is minimal.  Denies nasal congestion, cough, fever, vomiting, diarrhea, changes in taste or smell.  Denies known sick contacts.  Continues to eat and drink well.  Has been stressed recently and more tense than normal, does not grind teeth.  No injury or trauma.  No hearing changes or drainage.  Unresolved with Tylenol.  Denies other aggravating alleviating factors.  Reports otherwise doing well.  Juline Patch, MD : PcP     Past Medical History:  Diagnosis Date  . Crohn disease ([place])   . Seizures ([place])   . Stroke [facility])     There are no active problems to display for this patient.   Past Surgical History:  Procedure Laterality Date  . CESAREAN SECTION       No current facility-administered medications for this encounter.   Current Outpatient Medications:  .  amoxicillin (AMOXIL) 875 MG tablet, Take 1 tablet (875 mg total) by mouth 2 (two) times daily., Disp: 20 tablet, Rfl: 0  Allergies Ivp dye [iodinated diagnostic agents]  Family History  Problem Relation Age of Onset  . Heart attack Father     Social History Social History   Tobacco Use  . Smoking status: Never Smoker  . Smokeless tobacco: Never Used  Substance Use Topics  . Alcohol use: No  . Drug use: No    Review of Systems Constitutional: No fever ENT: Positive sore throat. Cardiovascular: Denies chest pain. Respiratory: Denies shortness of breath. Gastrointestinal: No abdominal pain.  No nausea, no vomiting.  No diarrhea.   Genitourinary: Negative for dysuria. Musculoskeletal: Negative  for back pain. Skin: Negative for rash. Neurological: Negative for  focal weakness or numbness.   ____________________________________________   PHYSICAL EXAM:  VITAL SIGNS: ED Triage Vitals  Enc Vitals Group     BP 06/20/19 0853 120/82     Pulse Rate 06/20/19 0853 71     Resp 06/20/19 0853 18     Temp 06/20/19 0853 98.3 F (36.8 C)     Temp Source 06/20/19 0853 Oral     SpO2 06/20/19 0853 99 %     Weight 06/20/19 0849 165 lb (74.8 kg)     Height 06/20/19 0849 5\' 3"  (1.6 m)     Head Circumference --      Peak Flow --      Pain Score 06/20/19 0849 3     Pain Loc --      Pain Edu? --      Excl. in [place]? --     Constitutional: Alert and oriented. Well appearing and in no acute distress. Eyes: Conjunctivae are normal.  Head: Atraumatic. No sinus tenderness to palpation. No swelling. No erythema.  Ears: Left: Minimal tenderness to auricle movement, normal canal, moderate erythema and dull TM.  Positive left TMJ tenderness, full range of motion present.  Right; nontender, normal canal, no erythema, normal TM.  No right TMJ tenderness.  Nose:No nasal congestion  Mouth/Throat: Mucous membranes are moist. No pharyngeal erythema. No tonsillar swelling or exudate.  Neck: No stridor.  No cervical spine tenderness to palpation. Hematological/Lymphatic/Immunilogical: No cervical lymphadenopathy. Cardiovascular:  Normal rate, regular rhythm. Grossly normal heart sounds.  Good peripheral circulation. Respiratory: Normal respiratory effort.  No retractions. No wheezes, rales or rhonchi. Good air movement.  Musculoskeletal: Ambulatory with steady gait.  Neurologic:  Normal speech and language. No gait instability. Skin:  Skin appears warm, dry and intact. No rash noted. Psychiatric: Mood and affect are normal. Speech and behavior are normal.  ___________________________________________   LABS (all labs ordered are listed, but only abnormal results are displayed)  Labs Reviewed  RAPID  STREP SCREEN ([facility] ONLY)  CULTURE, GROUP A STREP ([facility])  NOVEL CORONAVIRUS, NAA (HOSPITAL ORDER, SEND-OUT TO REF LAB)   ____________________________________________   PROCEDURES Procedures    INITIAL IMPRESSION / ASSESSMENT AND PLAN / ED COURSE  Pertinent labs & imaging results that were available during my care of the patient were reviewed by me and considered in my medical decision making (see chart for details).  Well-appearing patient.  No acute distress.  Left otitis media also left TMJ tenderness.  Strep negative, will culture.  COVID-19 testing also completed, advice given.  Encourage rest, fluids, supportive care, will treat with oral amoxicillin over-the-counter Tylenol ibuprofen and monitoring.Discussed indication, risks and benefits of medications with patient.   Discussed follow up with Primary care physician this week. Discussed follow up and return parameters including no resolution or any worsening concerns. Patient verbalized understanding and agreed to plan.   ____________________________________________   FINAL CLINICAL IMPRESSION(S) / ED DIAGNOSES  Final diagnoses:  Left otitis media, unspecified otitis media type  Arthralgia of left temporomandibular joint  Pharyngitis, unspecified etiology  Advice given about COVID-19 virus infection     ED Discharge Orders         Ordered    amoxicillin (AMOXIL) 875 MG tablet  2 times daily     06/20/19 0920           Note: This dictation was prepared with Dragon dictation along with smaller phrase technology. Any transcriptional errors that result from this process are unintentional.         Renford Dills, NP 06/20/19 [phone]

## 2019-06-21 LAB — NOVEL CORONAVIRUS, NAA (HOSP ORDER, SEND-OUT TO REF LAB; TAT 18-24 HRS): SARS-CoV-2, NAA: NOT DETECTED

## 2019-06-22 LAB — CULTURE, GROUP A STREP (THRC)

## 2019-07-04 ENCOUNTER — Other Ambulatory Visit: Payer: Self-pay

## 2019-07-04 ENCOUNTER — Ambulatory Visit
Admission: EM | Admit: 2019-07-04 | Discharge: 2019-07-04 | Disposition: A | Discharge status: Home / Self Care | Payer: 59 | Attending: Family Medicine | Admitting: Family Medicine

## 2019-07-04 DIAGNOSIS — R05 Cough: Secondary | ICD-10-CM | POA: Diagnosis not present

## 2019-07-04 DIAGNOSIS — J069 Acute upper respiratory infection, unspecified: Secondary | ICD-10-CM

## 2019-07-04 DIAGNOSIS — J029 Acute pharyngitis, unspecified: Secondary | ICD-10-CM | POA: Diagnosis not present

## 2019-07-04 DIAGNOSIS — R059 Cough, unspecified: Secondary | ICD-10-CM

## 2019-07-04 LAB — SARS CORONAVIRUS 2 AG (30 MIN TAT): SARS Coronavirus 2 Ag: NEGATIVE

## 2019-07-04 LAB — RAPID STREP SCREEN (MED CTR MEBANE ONLY): Streptococcus, Group A Screen (Direct): NEGATIVE

## 2019-07-04 MED ORDER — PSEUDOEPH-BROMPHEN-DM 30-2-10 MG/5ML PO SYRP: 10.0000 mL | ORAL_SOLUTION | Freq: Four times a day (QID) | ORAL | 0 refills | Status: AC | PRN

## 2019-07-04 MED ORDER — LIDOCAINE VISCOUS HCL 2 % MT SOLN: OROMUCOSAL | 0 refills | Status: DC

## 2019-07-04 NOTE — ED Provider Notes (Signed)
[facility]    CSN: [id number] Arrival date & time: 07/04/19  1538      History   Chief Complaint Chief Complaint  Patient presents with  . Cough    HPI Allison Brock is a 48 y.o. female presenting for cough and sore throat that began yesterday. She also admits to feeling fatigued. She denies fever, body aches,  congestion, cough, chest pain, breathing difficulty, abdominal pain, n/v/d, or changes in taste or smell. Denies known COVID exposure. Is otherwise healthy and denies cardiopulmonary disease. No other concerns today.   HPI  Past Medical History:  Diagnosis Date  . Crohn disease ([facility])   . Seizures ([facility])   . Stroke [facility])     There are no active problems to display for this patient.   Past Surgical History:  Procedure Laterality Date  . CESAREAN SECTION      OB History   No obstetric history on file.      Home Medications    Prior to Admission medications   Medication Sig Start Date End Date Taking? Authorizing Provider  amoxicillin (AMOXIL) 875 MG tablet Take 1 tablet (875 mg total) by mouth 2 (two) times daily. 06/20/19   Renford DillsMiller, Lindsey, NP  brompheniramine-pseudoephedrine-DM 30-2-10 MG/5ML syrup Take 10 mLs by mouth 4 (four) times daily as needed for up to 7 days. 07/04/19 07/11/19  Eusebio FriendlyEaves,  B, PA-C  lidocaine (XYLOCAINE) 2 % solution Swish and spit 15 ml q2-3h prn x 3 days 07/04/19   Eusebio FriendlyEaves,  B, PA-C  gabapentin (NEURONTIN) 100 MG capsule Take 1 capsule (100 mg total) by mouth 3 (three) times daily. 09/12/18 06/20/19  Duanne LimerickJones, Deanna C, MD  topiramate (TOPAMAX) 50 MG tablet Take 1/2-1 tablet twice a day for prevention of migraines Patient not taking: Reported on 09/12/2018 01/14/17 06/20/19  Duanne LimerickJones, Deanna C, MD    Family History Family History  Problem Relation Age of Onset  . Heart attack Father     Social History Social History   Tobacco Use  . Smoking status: Never Smoker  . Smokeless tobacco: Never Used  Substance Use  Topics  . Alcohol use: No  . Drug use: No     Allergies   Ivp dye [iodinated diagnostic agents]   Review of Systems Review of Systems  Constitutional: Positive for fatigue. Negative for fever.  HENT: Positive for sore throat. Negative for congestion, rhinorrhea, sinus pressure, sinus pain and trouble swallowing.   Respiratory: Positive for cough. Negative for shortness of breath.   Gastrointestinal: Negative for abdominal pain, diarrhea, nausea and vomiting.  Musculoskeletal: Negative for arthralgias and myalgias.  Neurological: Positive for headaches. Negative for weakness.     Physical Exam Triage Vital Signs ED Triage Vitals  Enc Vitals Group     BP 07/04/19 1617 (!) 151/94     Pulse Rate 07/04/19 1617 87     Resp 07/04/19 1617 16     Temp 07/04/19 1617 98.8 F (37.1 C)     Temp Source 07/04/19 1617 Oral     SpO2 07/04/19 1617 100 %     Weight 07/04/19 1614 160 lb (72.6 kg)     Height 07/04/19 1614 5\' 3"  (1.6 m)     Head Circumference --      Peak Flow --      Pain Score 07/04/19 1614 2     Pain Loc --      Pain Edu? --      Excl. in [place]? --  No data found.  Updated Vital Signs BP (!) 151/94 (BP Location: Right Arm)   Pulse 87   Temp 98.8 F (37.1 C) (Oral)   Resp 16   Ht 5\' 3"  (1.6 m)   Wt 160 lb (72.6 kg)   SpO2 100%   BMI 28.34 kg/m        Physical Exam Vitals signs and nursing note reviewed.  Constitutional:      General: She is not in acute distress.    Appearance: Normal appearance. She is normal weight. She is not ill-appearing or toxic-appearing.  HENT:     Head: Normocephalic and atraumatic.     Nose: Nose normal. No congestion or rhinorrhea.     Mouth/Throat:     Mouth: Mucous membranes are moist.     Pharynx: Oropharynx is clear. No posterior oropharyngeal erythema.  Eyes:     General: No scleral icterus.       Right eye: No discharge.        Left eye: No discharge.     Conjunctiva/sclera: Conjunctivae normal.  Neck:      Musculoskeletal: Normal range of motion and neck supple.  Cardiovascular:     Rate and Rhythm: Normal rate and regular rhythm.     Heart sounds: Normal heart sounds. No murmur.  Pulmonary:     Effort: Pulmonary effort is normal. No respiratory distress.     Breath sounds: Normal breath sounds.  Lymphadenopathy:     Cervical: No cervical adenopathy.  Skin:    General: Skin is warm and dry.     Findings: No rash.  Neurological:     General: No focal deficit present.     Mental Status: She is alert. Mental status is at baseline.     Motor: No weakness.     Gait: Gait normal.  Psychiatric:        Mood and Affect: Mood normal.        Behavior: Behavior normal.        Thought Content: Thought content normal.      UC Treatments / Results  Labs (all labs ordered are listed, but only abnormal results are displayed) Labs Reviewed  SARS CORONAVIRUS 2 AG (30 MIN TAT)  RAPID STREP SCREEN ([facility] ONLY)  CULTURE, GROUP A STREP ([facility])  NOVEL CORONAVIRUS, NAA (HOSP ORDER, SEND-OUT TO REF LAB; TAT 18-24 HRS)    EKG   Radiology No results found.  Procedures Procedures (including critical care time)  Medications Ordered in UC Medications - No data to display  Initial Impression / Assessment and Plan / UC Course  I have reviewed the triage vital signs and the nursing notes.  Pertinent labs & imaging results that were available during my care of the patient were reviewed by me and considered in my medical decision making (see chart for details).   Rapid COVID test and rapid strep test both negative. Sending out strep culture. Sending out COVID test. Patient educated about COVID today and advised to stay home and wait for test results. Would need to isolate 8 more days if positive. Patient's exam and vitals reassuring and advised her if COVID testing is negative, this is likely another virus and she should feel better within a week. ED precautions discussed.    Final Clinical  Impressions(s) / UC Diagnoses   Final diagnoses:  Acute upper respiratory infection  Acute pharyngitis, unspecified etiology  Cough     Discharge Instructions     URI/COLD SYMPTOMS: Your exam  today is consistent with a viral illness.  Rapid COVID test and rapid strep tests are both negative. Antibiotics are not indicated at this time. Use medications as directed, including cough syrup, nasal saline, and decongestants. Your symptoms should improve over the next few days and resolve within 7-10 days. Increase rest and fluids. F/u if symptoms worsen or predominate such as sore throat, ear pain, productive cough, shortness of breath, or if you develop high fevers or worsening fatigue over the next several days.   Your condition could be due to COVID 19 infection. Rapid testing is negative but we have obtained testing to send out to totally rule this out and results will return  in 1-4 days. In the mean time, assume this is going to be a positive test and treat/monitor yourself as if you do have COVID.   At this time go home and rest. Push fluids. Take Tylenol as needed for discomfort. Gargle warm salt water. Throat lozenges. Take Mucinex DM or Robitussin for cough. Humidifier in bedroom to ease coughing. Warm showers. Also review the COVID handout for more information.   COVID-19 INFECTION: The incubation period of COVID-19 is approximately 14 days after exposure, with most symptoms developing in roughly 4-5 days. Symptoms may range in severity from mild to critically severe. Roughly 80% of those infected will have mild symptoms. People of any age may become infected with COVID-19 and have the ability to transmit the virus. The most common symptoms include: fever, fatigue, cough, body aches, headaches, sore throat, nasal congestion, shortness of breath, nausea, vomiting, diarrhea, changes in smell and/or taste.     COURSE OF ILLNESS Some patients may begin with mild disease which can progress quickly  into critical symptoms. If your symptoms are worsening please call ahead to the Emergency Department and proceed there for further treatment. Recovery time appears to be roughly 1-2 weeks for mild symptoms and 3-6 weeks for severe disease.    GO IMMEDIATELY TO ER FOR FEVER >100.6, BREATHING PROBLEMS, CHEST PAIN, FATIGUE, LETHARGY, INABILITY TO EAT OR DRINK, ETC   QUARANTINE AND ISOLATION: To help decrease the spread of COVID-19 please remain isolated if you have COVID infection or are highly suspected to have COVID infection. This means -stay home and isolate to one room in the home if you live with others. Do not share a bed or bathroom with others while ill, sanitize and wipe down all countertops and keep common areas clean and disinfected. You may discontinue isolation if you have a mild case and are asymptomatic 10 days after symptom onset as long as you have been fever free >72 hours without having to take Motrin or Tylenol. If your case is more severe, you may have to isolate longer.    If you have been in close contact (within 6 feet) of someone diagnosed with COVID 19, you are advised to quarantine in your home for 14 days as symptoms can develop anywhere from 2-14 days after exposure to the virus. If you develop symptoms, you  must isolate.    During this global pandemic, CDC advises to practice social distancing, try to stay at least 73ft away from others at all times. Wear a face covering. Wash and sanitize your hands regularly and avoid going anywhere that is not necessary.   KEEP IN MIND THAT THE COVID TEST IS NOT 100% ACCURATE AND YOU SHOULD STILL DO EVERYTHING TO PREVENT POTENTIAL SPREAD OF VIRUS TO OTHERS (WEAR MASK, WEAR GLOVES, [place] HANDS AND SANITIZE REGULARLY)  ED Prescriptions    Medication Sig Dispense Auth. Provider   brompheniramine-pseudoephedrine-DM 30-2-10 MG/5ML syrup Take 10 mLs by mouth 4 (four) times daily as needed for up to 7 days. 120 mL Eusebio Friendly B, PA-C    lidocaine (XYLOCAINE) 2 % solution Swish and spit 15 ml q2-3h prn x 3 days 200 mL Shirlee Latch, PA-C     PDMP not reviewed this encounter.   Shirlee Latch, PA-C 07/04/19 1719

## 2019-07-04 NOTE — ED Triage Notes (Signed)
Patient states that she is here for a cough and sore throat that started yesterday. States that she has noticed some fatigue as well.

## 2019-07-04 NOTE — Discharge Instructions (Addendum)
URI/COLD SYMPTOMS: Your exam today is consistent with a viral illness.  Rapid COVID test and rapid strep tests are both negative. Antibiotics are not indicated at this time. Use medications as directed, including cough syrup, nasal saline, and decongestants. Your symptoms should improve over the next few days and resolve within 7-10 days. Increase rest and fluids. F/u if symptoms worsen or predominate such as sore throat, ear pain, productive cough, shortness of breath, or if you develop high fevers or worsening fatigue over the next several days.   Your condition could be due to COVID 19 infection. Rapid testing is negative but we have obtained testing to send out to totally rule this out and results will return  in 1-4 days. In the mean time, assume this is going to be a positive test and treat/monitor yourself as if you do have COVID.   At this time go home and rest. Push fluids. Take Tylenol as needed for discomfort. Gargle warm salt water. Throat lozenges. Take Mucinex DM or Robitussin for cough. Humidifier in bedroom to ease coughing. Warm showers. Also review the COVID handout for more information.   COVID-19 INFECTION: The incubation period of COVID-19 is approximately 14 days after exposure, with most symptoms developing in roughly 4-5 days. Symptoms may range in severity from mild to critically severe. Roughly 80% of those infected will have mild symptoms. People of any age may become infected with COVID-19 and have the ability to transmit the virus. The most common symptoms include: fever, fatigue, cough, body aches, headaches, sore throat, nasal congestion, shortness of breath, nausea, vomiting, diarrhea, changes in smell and/or taste.     COURSE OF ILLNESS Some patients may begin with mild disease which can progress quickly into critical symptoms. If your symptoms are worsening please call ahead to the Emergency Department and proceed there for further treatment. Recovery time appears to be  roughly 1-2 weeks for mild symptoms and 3-6 weeks for severe disease.    GO IMMEDIATELY TO ER FOR FEVER >100.6, BREATHING PROBLEMS, CHEST PAIN, FATIGUE, LETHARGY, INABILITY TO EAT OR DRINK, ETC   QUARANTINE AND ISOLATION: To help decrease the spread of COVID-19 please remain isolated if you have COVID infection or are highly suspected to have COVID infection. This means -stay home and isolate to one room in the home if you live with others. Do not share a bed or bathroom with others while ill, sanitize and wipe down all countertops and keep common areas clean and disinfected. You may discontinue isolation if you have a mild case and are asymptomatic 10 days after symptom onset as long as you have been fever free >72 hours without having to take Motrin or Tylenol. If your case is more severe, you may have to isolate longer.    If you have been in close contact (within 6 feet) of someone diagnosed with COVID 19, you are advised to quarantine in your home for 14 days as symptoms can develop anywhere from 2-14 days after exposure to the virus. If you develop symptoms, you  must isolate.    During this global pandemic, CDC advises to practice social distancing, try to stay at least 72ft away from others at all times. Wear a face covering. Wash and sanitize your hands regularly and avoid going anywhere that is not necessary.   KEEP IN MIND THAT THE COVID TEST IS NOT 100% ACCURATE AND YOU SHOULD STILL DO EVERYTHING TO PREVENT POTENTIAL SPREAD OF VIRUS TO OTHERS (WEAR MASK, [place], [place]  AND SANITIZE REGULARLY)

## 2019-07-05 LAB — NOVEL CORONAVIRUS, NAA (HOSP ORDER, SEND-OUT TO REF LAB; TAT 18-24 HRS): SARS-CoV-2, NAA: NOT DETECTED

## 2019-07-07 LAB — CULTURE, GROUP A STREP (THRC)

## 2019-07-25 ENCOUNTER — Other Ambulatory Visit: Payer: Self-pay

## 2019-07-25 ENCOUNTER — Ambulatory Visit
Admission: EM | Admit: 2019-07-25 | Discharge: 2019-07-25 | Disposition: A | Discharge status: Home / Self Care | Payer: 59 | Attending: Family Medicine | Admitting: Family Medicine

## 2019-07-25 ENCOUNTER — Encounter: Payer: Self-pay | Admitting: Emergency Medicine

## 2019-07-25 DIAGNOSIS — R112 Nausea with vomiting, unspecified: Secondary | ICD-10-CM

## 2019-07-25 DIAGNOSIS — Z20822 Contact with and (suspected) exposure to covid-19: Secondary | ICD-10-CM

## 2019-07-25 DIAGNOSIS — R05 Cough: Secondary | ICD-10-CM | POA: Diagnosis not present

## 2019-07-25 DIAGNOSIS — J029 Acute pharyngitis, unspecified: Secondary | ICD-10-CM | POA: Diagnosis not present

## 2019-07-25 DIAGNOSIS — R519 Headache, unspecified: Secondary | ICD-10-CM

## 2019-07-25 DIAGNOSIS — R197 Diarrhea, unspecified: Secondary | ICD-10-CM | POA: Diagnosis not present

## 2019-07-25 DIAGNOSIS — Z7189 Other specified counseling: Secondary | ICD-10-CM

## 2019-07-25 DIAGNOSIS — J069 Acute upper respiratory infection, unspecified: Secondary | ICD-10-CM

## 2019-07-25 LAB — RAPID STREP SCREEN (MED CTR MEBANE ONLY): Streptococcus, Group A Screen (Direct): NEGATIVE

## 2019-07-25 MED ORDER — HYDROCOD POLST-CPM POLST ER 10-8 MG/5ML PO SUER: 5.0000 mL | Freq: Two times a day (BID) | ORAL | 0 refills | Status: DC | PRN

## 2019-07-25 NOTE — ED Provider Notes (Signed)
[place], [place]   Name: Allison Brock DOB: [birth year] MRN: [id number] CSN: [id number] PCP: Juline Patch, MD  Arrival date and time:  07/25/19 1353  Chief Complaint:  Cough, Sore Throat, and Headache   NOTE: Prior to seeing the patient today, I have reviewed the triage nursing documentation and vital signs. Clinical staff has updated patient's PMH/PSHx, current medication list, and drug allergies/intolerances to ensure comprehensive history available to assist in medical decision making.   History:   HPI: Allison Brock is a 48 y.o. female who presents today with complaints of cough, sore throat, generalized headache, and chills that started over the last 2-3 days. Patient has been running an elevated temperature, and reports that her Tmax has been 99.5. Cough has been non-productive and noted to be worse at night. Cough is causing difficulties with her sleep. She endorses that she has experienced  nausea, vomiting, diarrhea, however notes that these symptoms are baseline for her due to her Crohn's disease. She is eating and drinking well. Patient denies any perceived alterations to her sense of taste or smell. Patient presents with concerns for her personal health after being exposed to someone who tested positive for SARS-CoV-2 (novel coronavirus). Patient is an Therapist, music by trade. She reports that she spent 45-60 minutes in close contact with a patient on 07/13/2019. She reports that the individual who tested positive contacted the office yesterday to advise them of his positive test results. She has not been tested for SARS-CoV-2 (novel coronavirus) in the past 14 days; last tested negative on 07/04/2019 per her report. Patient has not been vaccinated for influenza this season. In efforts to conservatively manage her symptoms at home, the patient notes that she has used APAP, Delsym, and IBU which has helped to improve her symptoms to some degree.   Past Medical History:  Diagnosis Date    . Crohn disease ([place])   . Seizures ([place])   . Stroke [facility])     Past Surgical History:  Procedure Laterality Date  . CESAREAN SECTION      Family History  Problem Relation Age of Onset  . Heart attack Father     Social History   Tobacco Use  . Smoking status: Never Smoker  . Smokeless tobacco: Never Used  Substance Use Topics  . Alcohol use: No  . Drug use: No    There are no problems to display for this patient.   Home Medications:    No outpatient medications have been marked as taking for the 07/25/19 encounter [facility] Encounter).    Allergies:   Ivp dye [iodinated diagnostic agents]  Review of Systems (ROS): Review of Systems  Constitutional: Positive for chills and fever. Negative for fatigue.  HENT: Positive for postnasal drip, rhinorrhea and sore throat. Negative for congestion, ear pain, sinus pressure, sinus pain and sneezing.   Eyes: Negative for pain, discharge and redness.  Respiratory: Positive for cough. Negative for chest tightness and shortness of breath.   Cardiovascular: Negative for chest pain and palpitations.  Gastrointestinal: Positive for diarrhea (chronic issue), nausea (chronic issue) and vomiting (chronic issue). Negative for abdominal pain.       PMH (+) Crohn's disease  Musculoskeletal: Negative for arthralgias, back pain, myalgias and neck pain.  Skin: Negative for color change, pallor and rash.  Neurological: Positive for headaches. Negative for dizziness, syncope and weakness.  Hematological: Negative for adenopathy.     Vital Signs: Today's Vitals   07/25/19 1426 07/25/19 1428 07/25/19 1447  BP:  Marland Kitchen)  141/88   Pulse:  82   Resp:  18   Temp:  98.9 F (37.2 C)   TempSrc:  Oral   SpO2:  99%   Weight: 160 lb (72.6 kg)    Height: 5\' 3"  (1.6 m)    PainSc: 6   6     Physical Exam: Physical Exam  Constitutional: She is oriented to person, place, and time and well-developed, well-nourished, and in no distress.  HENT:  Head:  Normocephalic and atraumatic.  Right Ear: Tympanic membrane normal.  Left Ear: Tympanic membrane normal.  Nose: Mucosal edema and rhinorrhea present. No sinus tenderness.  Mouth/Throat: Uvula is midline. Posterior oropharyngeal erythema (+) clear PND present. No oropharyngeal exudate or posterior oropharyngeal edema.  Eyes: Pupils are equal, round, and reactive to light.  Cardiovascular: Normal rate, regular rhythm, normal heart sounds and intact distal pulses.  Pulmonary/Chest: Effort normal and breath sounds normal.  Cough in clinic. No SOB or increased WOB. Able to speak in complete sentences. No wheezing or stridor. SPO2 99% on RA.   Neurological: She is alert and oriented to person, place, and time. Gait normal.  Skin: Skin is warm and dry. No rash noted. She is not diaphoretic.  Psychiatric: Memory, affect and judgment normal. Her mood appears anxious.  Nursing note and vitals reviewed.   Urgent Care Treatments / Results:  LABS: PLEASE NOTE: all labs that were ordered this encounter are listed, however only abnormal results are displayed. Labs Reviewed  RAPID STREP SCREEN ([facility] ONLY)  NOVEL CORONAVIRUS, NAA (HOSP ORDER, SEND-OUT TO REF LAB; TAT 18-24 HRS)  CULTURE, GROUP A STREP [facility])    EKG: -None  RADIOLOGY: No results found.  PROCEDURES: Procedures  MEDICATIONS RECEIVED THIS VISIT: Medications - No data to display  PERTINENT CLINICAL COURSE NOTES/UPDATES:   Initial Impression / Assessment and Plan / Urgent Care Course:  Pertinent labs & imaging results that were available during my care of the patient were personally reviewed by me and considered in my medical decision making (see lab/imaging section of note for values and interpretations).  Allison Brock is a 48 y.o. female who presents to [facility] today with complaints of Cough, Sore Throat, and Headache   Patient overall well appearing and in no acute distress today in clinic. Presenting  symptoms (see HPI) and exam as documented above. She presents with symptoms associated with SARS-CoV-2 (novel coronavirus) following a close exposure to an individual who has tested positive for the virus; see HPI.  Discussed typical symptom constellation. Reviewed potential for infection and need for testing. Patient amenable to being tested. SARS-CoV-2 swab collected by certified clinical staff. Discussed variable turn around times associated with testing, as swabs are being processed at [facility], and have been taking between 2-5 days to come back. She was advised to self quarantine, per [facility] DHHS guidelines, until negative results received. These measures are being implemented out of an abundance of caution to prevent transmission and spread during the current SARS-CoV-2 pandemic.  Rapid streptococcal throat swab (-); reflex culture sent. Presenting symptoms consistent with acute viral illness. Until ruled out with confirmatory lab testing, SARS-CoV-2 remains part of the differential. Her testing is pending at this time. I discussed with her that her symptoms are felt to be viral in nature, thus antibiotics would not offer her any relief or improve his symptoms any faster than conservative symptomatic management. Cough is significantly worse at night and is reported to be causing her difficulty with sleep.  Will send in a supply of Tussionex for PRN use. Discussed indications and side effects of this medication; encouraged to exercise caution as it can cause somnolence. Discussed supportive care measures at home during acute phase of illness. Patient to rest as much as possible. She was encouraged to ensure adequate hydration (water and ORS) to prevent dehydration and electrolyte derangements. Patient may use APAP and/or IBU on an as needed basis for pain/fever.    Current clinical condition warrants patient being out of work in order to quarantine while waiting for testing results. She was provided with the  appropriate documentation to provide to her place of employment that will allow for her to RTW on 07/28/2019 with no restrictions. RTW is contingent on her SARS-CoV-2 test results being reviewed as negative.   Discussed follow up with primary care physician in 1 week for re-evaluation. I have reviewed the follow up and strict return precautions for any new or worsening symptoms. Patient is aware of symptoms that would be deemed urgent/emergent, and would thus require further evaluation either here or in the emergency department. At the time of discharge, she verbalized understanding and consent with the discharge plan as it was reviewed with her. All questions were fielded by provider and/or clinic staff prior to patient discharge.    Final Clinical Impressions / Urgent Care Diagnoses:   Final diagnoses:  Viral URI with cough  Close exposure to COVID-19 virus  Encounter for laboratory testing for COVID-19 virus  Advice given about COVID-19 virus infection    New Prescriptions:  [place] Controlled Substance Registry consulted? Not Applicable  Meds ordered this encounter  Medications  . chlorpheniramine-HYDROcodone (TUSSIONEX PENNKINETIC ER) 10-8 MG/5ML SUER    Sig: Take 5 mLs by mouth every 12 (twelve) hours as needed for cough.    Dispense:  70 mL    Refill:  0    Recommended Follow up Care:  Patient encouraged to follow up with the following provider within the specified time frame, or sooner as dictated by the severity of her symptoms. As always, she was instructed that for any urgent/emergent care needs, she should seek care either here or in the emergency department for more immediate evaluation.  Follow-up Information    Duanne LimerickJones, Deanna C, MD In 1 week.   Specialty: Family Medicine Why: General reassessment of symptoms if not improving Contact information: [address] [place] [address] [phone]         NOTE: This note was prepared using Dragon dictation  software along with smaller phrase technology. Despite my best ability to proofread, there is the potential that transcriptional errors may still occur from this process, and are completely unintentional.    Verlee MonteGray, Claudia Alvizo E, NP 07/25/19 2221

## 2019-07-25 NOTE — Discharge Instructions (Addendum)
It was very nice seeing you today in clinic. Thank you for entrusting me with your care.   You were tested for SARS-CoV-2 (novel coronavirus) today. Testing is performed by an outside lab ([facility]) and has variable turn around times ranging between 2-5 days. Current recommendations from the the CDC and [place] DHHS require that you remain out of work in order to quarantine at home until negative test results are have been received. In the event that your test results are positive, you will be contacted with further directives. These measures are being implemented out of an abundance of caution to prevent transmission and spread during the current SARS-CoV-2 pandemic.   If you develop any worsening symptoms or concerns, make arrangements to follow up with your regular doctor. If your symptoms are severe, please seek follow up care in the ER. Please remember, our [place] providers are "right here with you" when you need us.   Again, it was my pleasure to take care of you today. Thank you for choosing our clinic. I hope that you start to feel better quickly.   Amdrew Oboyle, MSN, APRN, FNP-C, CEN Advanced Practice Provider [facility]  

## 2019-07-25 NOTE — ED Triage Notes (Signed)
Patient states she started with a cough and sore throat on Thursday and then started to have a headache on Friday. She had a positive exposure on December 3rd.

## 2019-07-26 LAB — NOVEL CORONAVIRUS, NAA (HOSP ORDER, SEND-OUT TO REF LAB; TAT 18-24 HRS): SARS-CoV-2, NAA: NOT DETECTED

## 2019-07-28 LAB — CULTURE, GROUP A STREP (THRC)

## 2019-11-13 ENCOUNTER — Telehealth: Payer: Self-pay

## 2019-11-13 NOTE — Telephone Encounter (Signed)
Called pt and left a message to return call.

## 2020-08-13 ENCOUNTER — Encounter: Payer: Self-pay | Admitting: Emergency Medicine

## 2020-08-13 ENCOUNTER — Other Ambulatory Visit: Payer: Self-pay

## 2020-08-13 ENCOUNTER — Ambulatory Visit
Admission: EM | Admit: 2020-08-13 | Discharge: 2020-08-13 | Disposition: A | Discharge status: Home / Self Care | Payer: 59

## 2020-08-13 DIAGNOSIS — M778 Other enthesopathies, not elsewhere classified: Secondary | ICD-10-CM

## 2020-08-13 DIAGNOSIS — M7711 Lateral epicondylitis, right elbow: Secondary | ICD-10-CM

## 2020-08-13 MED ORDER — DICLOFENAC SODIUM 50 MG PO TBEC: 50.0000 mg | DELAYED_RELEASE_TABLET | Freq: Two times a day (BID) | ORAL | 0 refills | Status: AC

## 2020-08-13 NOTE — ED Triage Notes (Signed)
Pt c/o bilateral hand pain, and right elbow pain. Started about 2 days ago. She also states she has been playing a lot of ping pong with her son.

## 2020-08-13 NOTE — ED Provider Notes (Signed)
[facility]    CSN: [id number] Arrival date & time: 08/13/20  1248      History   Chief Complaint Chief Complaint  Patient presents with  . Elbow Pain    right  . Hand Pain    bilateral    HPI Allison Brock is a 50 y.o. female presenting for 2 to 3-day history of bilateral hand pain which primarily affects the thumb and index fingers as well as the radial wrist.  Patient also admits to 2 to 3-day history of lateral right elbow pain.  She is right-handed.  Patient states that she has been playing a lot of ping-pong lately with her son.  She says that she stopped playing such ping-pong and her elbow seemed to improve.  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  She has been taking Advil and Tylenol which do seem to improve her pain.  Denies any numbness or tingling, but states that her hands feel cold and warm seem to improve not feeling.  No history of joint problems.  No other complaints or concerns.  HPI  Past Medical History:  Diagnosis Date  . Crohn disease ([facility])   . Seizures ([facility])   . Stroke [facility])     There are no problems to display for this patient.   Past Surgical History:  Procedure Laterality Date  . CESAREAN SECTION      OB History   No obstetric history on file.      Home Medications    Prior to Admission medications   Medication Sig Start Date End Date Taking? Authorizing Provider  cimetidine (TAGAMET) 400 MG tablet Take 400 mg by mouth 2 (two) times daily.   Yes [provider]  diclofenac (VOLTAREN) 50 MG EC tablet Take 1 tablet (50 mg total) by mouth 2 (two) times daily for 10 days. 08/13/20 08/23/20 Yes Shirlee Latch, PA-C  chlorpheniramine-HYDROcodone (TUSSIONEX PENNKINETIC ER) 10-8 MG/5ML SUER Take 5 mLs by mouth every 12 (twelve) hours as needed for cough. 07/25/19   Verlee Monte, NP  gabapentin (NEURONTIN) 100 MG capsule Take 1 capsule (100 mg  total) by mouth 3 (three) times daily. 09/12/18 06/20/19  Duanne Limerick, MD  topiramate (TOPAMAX) 50 MG tablet Take 1/2-1 tablet twice a day for prevention of migraines Patient not taking: Reported on 09/12/2018 01/14/17 06/20/19  Duanne Limerick, MD    Family History Family History  Problem Relation Age of Onset  . Heart attack Father     Social History Social History   Tobacco Use  . Smoking status: Never Smoker  . Smokeless tobacco: Never Used  Vaping Use  . Vaping Use: Never used  Substance Use Topics  . Alcohol use: No  . Drug use: No     Allergies   Ivp dye [iodinated diagnostic agents]   Review of Systems Review of Systems  Musculoskeletal: Positive for arthralgias. Negative for joint swelling and myalgias.  Skin: Negative for color change, rash and wound.  Neurological: Negative for weakness and numbness.     Physical Exam Triage Vital Signs ED Triage Vitals  Enc Vitals Group     BP 08/13/20 1502 (!) 159/85     Pulse Rate 08/13/20 1502 79     Resp 08/13/20 1502 18     Temp 08/13/20 1502 98.4 F (36.9 C)     Temp Source 08/13/20 1502 Oral  SpO2 08/13/20 1502 100 %     Weight 08/13/20 1501 160 lb 0.9 oz (72.6 kg)     Height 08/13/20 1501 5\' 3"  (1.6 m)     Head Circumference --      Peak Flow --      Pain Score 08/13/20 1500 3     Pain Loc --      Pain Edu? --      Excl. in [place]? --    No data found.  Updated Vital Signs BP (!) 159/85 (BP Location: Right Arm)   Pulse 79   Temp 98.4 F (36.9 C) (Oral)   Resp 18   Ht 5\' 3"  (1.6 m)   Wt 160 lb 0.9 oz (72.6 kg)   SpO2 100%   BMI 28.35 kg/m   Physical Exam Vitals and nursing note reviewed.  Constitutional:      General: She is not in acute distress.    Appearance: Normal appearance. She is not ill-appearing or toxic-appearing.  HENT:     Head: Normocephalic and atraumatic.  Eyes:     General: No scleral icterus.       Right eye: No discharge.        Left eye: No discharge.      Conjunctiva/sclera: Conjunctivae normal.  Cardiovascular:     Rate and Rhythm: Normal rate and regular rhythm.     Pulses: Normal pulses.  Pulmonary:     Effort: Pulmonary effort is normal. No respiratory distress.  Musculoskeletal:     Right elbow: No swelling. Normal range of motion. Tenderness present in lateral epicondyle.     Right hand: Tenderness (diffuse TTP index finger and thumb) present. Normal range of motion.     Left hand: Tenderness (diffuse TTP index finger and thumb) present. Normal range of motion.     Cervical back: Neck supple.     Comments: +finkelstiein bilaterally  Skin:    General: Skin is dry.  Neurological:     General: No focal deficit present.     Mental Status: She is alert. Mental status is at baseline.     Motor: No weakness.     Gait: Gait normal.  Psychiatric:        Mood and Affect: Mood normal.        Behavior: Behavior normal.        Thought Content: Thought content normal.      UC Treatments / Results  Labs (all labs ordered are listed, but only abnormal results are displayed) Labs Reviewed - No data to display  EKG   Radiology No results found.  Procedures Procedures (including critical care time)  Medications Ordered in UC Medications - No data to display  Initial Impression / Assessment and Plan / UC Course  I have reviewed the triage vital signs and the nursing notes.  Pertinent labs & imaging results that were available during my care of the patient were reviewed by me and considered in my medical decision making (see chart for details).   Treating patient with diclofenac at this time.  Offered bracing but she declined.  Patient given a work note for couple days since fine movements of her hands could cause her tendinitis longer to heal.  Advised her to hold off on any exacerbating movements or maneuvers until she feels better.  Advised supportive care.  Advised follow-up with Ortho if not getting better in the next couple  weeks.  Final Clinical Impressions(s) / UC Diagnoses   Final diagnoses:  Lateral epicondylitis of right elbow  Tendinitis of hand     Discharge Instructions     You may have a condition requiring you to follow up with Orthopedics so please call one of the following office for appointment:   [facility] [address] [place], [place] [address] Phone: [phone]  [facility] [address], [place], [place] [address] Phone: [phone]    ED Prescriptions    Medication Sig Dispense Auth. Provider   diclofenac (VOLTAREN) 50 MG EC tablet Take 1 tablet (50 mg total) by mouth 2 (two) times daily for 10 days. 20 tablet Shirlee Latch, PA-C     I have reviewed the PDMP during this encounter.   Shirlee Latch, PA-C 08/13/20 1542

## 2020-08-13 NOTE — Discharge Instructions (Signed)
You may have a condition requiring you to follow up with Orthopedics so please call one of the following office for appointment:   [facility] [address] [place], [place] [address] Phone: [phone]  [facility] [address] [place], [place] [address] Phone: [phone]

## 2020-08-14 ENCOUNTER — Ambulatory Visit: Payer: Self-pay

## 2020-10-04 ENCOUNTER — Ambulatory Visit
Admission: RE | Admit: 2020-10-04 | Discharge: 2020-10-04 | Disposition: A | Discharge status: Home / Self Care | Payer: 59 | Source: Ambulatory Visit | Attending: Family Medicine | Admitting: Family Medicine

## 2020-10-04 ENCOUNTER — Other Ambulatory Visit: Payer: Self-pay

## 2020-10-04 VITALS — BP 154/94 | HR 82 | Temp 98.6°F | Resp 14 | Ht 63.0 in | Wt 152.0 lb

## 2020-10-04 DIAGNOSIS — R21 Rash and other nonspecific skin eruption: Secondary | ICD-10-CM

## 2020-10-04 MED ORDER — TRIAMCINOLONE ACETONIDE 0.5 % EX OINT: 1.0000 "application " | TOPICAL_OINTMENT | Freq: Two times a day (BID) | CUTANEOUS | 0 refills | Status: DC

## 2020-10-04 NOTE — ED Triage Notes (Signed)
Patient states that she woke up this morning and felt a burning sensation and tenderness on the back of her neck. Patient reports she then noticed a rash on the back of her neck.

## 2020-10-04 NOTE — Discharge Instructions (Signed)
Medication as prescribed.  Take care  Dr. Gursimran Litaker  

## 2020-10-06 NOTE — ED Provider Notes (Signed)
[facility]    CSN: [id number] Arrival date & time: 10/04/20  1831      History   Chief Complaint Chief Complaint  Patient presents with  . Rash   HPI  50 year old female presents with rash.  Woke up this morning and felt a burning sensation on the back of her neck.  She subsequently noted rash.  She has some areas on the anterior chest as well as the left arm.  Pain 4/10 in severity.  Some itching.  No relieving factors.  No medications or interventions tried.  No other complaints.  Past Medical History:  Diagnosis Date  . Crohn disease ([facility])   . Seizures ([facility])   . Stroke [facility])    Past Surgical History:  Procedure Laterality Date  . CESAREAN SECTION     OB History   No obstetric history on file.    Home Medications    Prior to Admission medications   Medication Sig Start Date End Date Taking? Authorizing Provider  triamcinolone ointment (KENALOG) 0.5 % Apply 1 application topically 2 (two) times daily. 10/04/20  Yes Rayburn Mundis G, DO  cimetidine (TAGAMET) 400 MG tablet Take 400 mg by mouth 2 (two) times daily.  10/04/20  [provider]  gabapentin (NEURONTIN) 100 MG capsule Take 1 capsule (100 mg total) by mouth 3 (three) times daily. 09/12/18 06/20/19  Duanne Limerick, MD  topiramate (TOPAMAX) 50 MG tablet Take 1/2-1 tablet twice a day for prevention of migraines Patient not taking: Reported on 09/12/2018 01/14/17 06/20/19  Duanne Limerick, MD    Family History Family History  Problem Relation Age of Onset  . Heart attack Father     Social History Social History   Tobacco Use  . Smoking status: Never Smoker  . Smokeless tobacco: Never Used  Vaping Use  . Vaping Use: Never used  Substance Use Topics  . Alcohol use: No  . Drug use: No     Allergies   Ivp dye [iodinated diagnostic agents]   Review of Systems Review of Systems  Constitutional: Negative.   Skin: Positive for rash.   Physical Exam Triage Vital Signs ED Triage  Vitals  Enc Vitals Group     BP 10/04/20 1844 (!) 154/94     Pulse Rate 10/04/20 1844 82     Resp 10/04/20 1844 14     Temp 10/04/20 1844 98.6 F (37 C)     Temp Source 10/04/20 1844 Oral     SpO2 10/04/20 1844 100 %     Weight 10/04/20 1841 152 lb (68.9 kg)     Height 10/04/20 1841 5\' 3"  (1.6 m)     Head Circumference --      Peak Flow --      Pain Score 10/04/20 1841 4     Pain Loc --      Pain Edu? --      Excl. in [place]? --    Updated Vital Signs BP (!) 154/94 (BP Location: Right Arm)   Pulse 82   Temp 98.6 F (37 C) (Oral)   Resp 14   Ht 5\' 3"  (1.6 m)   Wt 68.9 kg   SpO2 100%   BMI 26.93 kg/m   Visual Acuity Right Eye Distance:   Left Eye Distance:   Bilateral Distance:    Right Eye Near:   Left Eye Near:    Bilateral Near:     Physical Exam Constitutional:      General: She  is not in acute distress.    Appearance: Normal appearance. She is not ill-appearing.  HENT:     Head: Normocephalic and atraumatic.  Cardiovascular:     Rate and Rhythm: Normal rate and regular rhythm.  Pulmonary:     Effort: Pulmonary effort is normal.     Breath sounds: Normal breath sounds. No wheezing, rhonchi or rales.  Skin:    Comments: Raised slightly erythematous rash noted on the back of the neck, left arm, chest.  Neurological:     Mental Status: She is alert.  Psychiatric:        Mood and Affect: Mood normal.        Behavior: Behavior normal.    UC Treatments / Results  Labs (all labs ordered are listed, but only abnormal results are displayed) Labs Reviewed - No data to display  EKG   Radiology No results found.  Procedures Procedures (including critical care time)  Medications Ordered in UC Medications - No data to display  Initial Impression / Assessment and Plan / UC Course  I have reviewed the triage vital signs and the nursing notes.  Pertinent labs & imaging results that were available during my care of the patient were reviewed by me and  considered in my medical decision making (see chart for details).    50 year old female presents with rash. Treating with triamcinolone.   Final Clinical Impressions(s) / UC Diagnoses   Final diagnoses:  Rash and nonspecific skin eruption     Discharge Instructions     Medication as prescribed.  Take care  Dr. Adriana Simas    ED Prescriptions    Medication Sig Dispense Auth. Provider   triamcinolone ointment (KENALOG) 0.5 % Apply 1 application topically 2 (two) times daily. 30 g Tommie Sams, DO     PDMP not reviewed this encounter.   Everlene Other [place], [place] 10/06/20 [phone]

## 2020-12-05 ENCOUNTER — Ambulatory Visit
Admission: RE | Admit: 2020-12-05 | Discharge: 2020-12-05 | Disposition: A | Discharge status: Home / Self Care | Payer: 59 | Source: Ambulatory Visit | Attending: Family Medicine | Admitting: Family Medicine

## 2020-12-05 ENCOUNTER — Other Ambulatory Visit: Payer: Self-pay

## 2020-12-05 VITALS — BP 129/86 | HR 80 | Temp 98.1°F | Resp 18

## 2020-12-05 DIAGNOSIS — R42 Dizziness and giddiness: Secondary | ICD-10-CM | POA: Diagnosis not present

## 2020-12-05 DIAGNOSIS — J069 Acute upper respiratory infection, unspecified: Secondary | ICD-10-CM | POA: Diagnosis not present

## 2020-12-05 MED ORDER — CETIRIZINE-PSEUDOEPHEDRINE ER 5-120 MG PO TB12: 1.0000 | ORAL_TABLET | Freq: Two times a day (BID) | ORAL | 0 refills | Status: DC | PRN

## 2020-12-05 MED ORDER — MECLIZINE HCL 25 MG PO TABS: 25.0000 mg | ORAL_TABLET | Freq: Three times a day (TID) | ORAL | 0 refills | Status: DC | PRN

## 2020-12-05 NOTE — ED Triage Notes (Signed)
Pt is present today with bilateral ear pain, sore throat, cough, and dizziness. Pt states that with the dizziness she feels like the room is spinning. Pt states sx started a few days ago.

## 2020-12-05 NOTE — ED Provider Notes (Signed)
[facility]    CSN: [id number] Arrival date & time: 12/05/20  0948      History   Chief Complaint Chief Complaint  Patient presents with  . Sore Throat  . Otalgia  . Dizziness   HPI  50 year old female presents with respiratory symptoms.  Patient reports that she has been sick for the past few days.  She reports ear pain, sore throat, cough, sneezing, dizziness.  No fever.  Pain 4/10 in severity.  She reports that her ear pain is bilateral.  No relieving factors.  No other reported symptoms.  No other complaints.  Past Medical History:  Diagnosis Date  . Crohn disease (HCC)   . Seizures (HCC)   . Stroke [facility])    Past Surgical History:  Procedure Laterality Date  . CESAREAN SECTION     OB History   No obstetric history on file.    Home Medications    Prior to Admission medications   Medication Sig Start Date End Date Taking? Authorizing Provider  cetirizine-pseudoephedrine (ZYRTEC-D) 5-120 MG tablet Take 1 tablet by mouth 2 (two) times daily as needed for allergies or rhinitis (Ear pain). 12/05/20  Yes Klye Besecker G, DO  meclizine (ANTIVERT) 25 MG tablet Take 1 tablet (25 mg total) by mouth 3 (three) times daily as needed for dizziness. 12/05/20  Yes Saxton Chain G, DO  cimetidine (TAGAMET) 400 MG tablet Take 400 mg by mouth 2 (two) times daily.  10/04/20  [provider]  gabapentin (NEURONTIN) 100 MG capsule Take 1 capsule (100 mg total) by mouth 3 (three) times daily. 09/12/18 06/20/19  Duanne Limerick, MD  topiramate (TOPAMAX) 50 MG tablet Take 1/2-1 tablet twice a day for prevention of migraines Patient not taking: Reported on 09/12/2018 01/14/17 06/20/19  Duanne Limerick, MD    Family History Family History  Problem Relation Age of Onset  . Heart attack Father     Social History Social History   Tobacco Use  . Smoking status: Never Smoker  . Smokeless tobacco: Never Used  Vaping Use  . Vaping Use: Never used  Substance Use Topics  .  Alcohol use: No  . Drug use: No     Allergies   Ivp dye [iodinated diagnostic agents]   Review of Systems Review of Systems Per HPI  Physical Exam Triage Vital Signs ED Triage Vitals  Enc Vitals Group     BP 12/05/20 1021 129/86     Pulse Rate 12/05/20 1021 80     Resp 12/05/20 1021 18     Temp 12/05/20 1021 98.1 F (36.7 C)     Temp Source 12/05/20 1021 Oral     SpO2 12/05/20 1021 99 %     Weight --      Height --      Head Circumference --      Peak Flow --      Pain Score 12/05/20 1019 4     Pain Loc --      Pain Edu? --      Excl. in GC? --    Updated Vital Signs BP 129/86 (BP Location: Right Arm)   Pulse 80   Temp 98.1 F (36.7 C) (Oral)   Resp 18   SpO2 99%   Visual Acuity Right Eye Distance:   Left Eye Distance:   Bilateral Distance:    Right Eye Near:   Left Eye Near:    Bilateral Near:     Physical Exam Vitals and  nursing note reviewed.  Constitutional:      General: She is not in acute distress.    Appearance: She is well-developed. She is not ill-appearing.  HENT:     Head: Normocephalic and atraumatic.     Right Ear: Tympanic membrane normal.     Left Ear: Tympanic membrane normal.     Mouth/Throat:     Pharynx: Oropharynx is clear. No oropharyngeal exudate or posterior oropharyngeal erythema.  Cardiovascular:     Rate and Rhythm: Normal rate and regular rhythm.     Heart sounds: No murmur heard.   Pulmonary:     Effort: Pulmonary effort is normal.     Breath sounds: Normal breath sounds. No wheezing, rhonchi or rales.  Neurological:     Mental Status: She is alert.  Psychiatric:        Mood and Affect: Mood normal.    UC Treatments / Results  Labs (all labs ordered are listed, but only abnormal results are displayed) Labs Reviewed - No data to display  EKG   Radiology No results found.  Procedures Procedures (including critical care time)  Medications Ordered in UC Medications - No data to display  Initial  Impression / Assessment and Plan / UC Course  I have reviewed the triage vital signs and the nursing notes.  Pertinent labs & imaging results that were available during my care of the patient were reviewed by me and considered in my medical decision making (see chart for details).    50 year old female presents with a viral respiratory infection.  She is having vertigo as well.  Zyrtec-D and meclizine as directed.  Supportive care.  Final Clinical Impressions(s) / UC Diagnoses   Final diagnoses:  Viral upper respiratory tract infection  Dizziness   Discharge Instructions   None    ED Prescriptions    Medication Sig Dispense Auth. Provider   meclizine (ANTIVERT) 25 MG tablet Take 1 tablet (25 mg total) by mouth 3 (three) times daily as needed for dizziness. 30 tablet Jaelyne Deeg G, DO   cetirizine-pseudoephedrine (ZYRTEC-D) 5-120 MG tablet Take 1 tablet by mouth 2 (two) times daily as needed for allergies or rhinitis (Ear pain). 30 tablet Tommie Sams, DO     PDMP not reviewed this encounter.   Tommie Sams, [place] 12/05/20 1131

## 2021-06-25 DIAGNOSIS — I639 Cerebral infarction, unspecified: Secondary | ICD-10-CM | POA: Insufficient documentation

## 2021-10-26 ENCOUNTER — Ambulatory Visit
Admission: RE | Admit: 2021-10-26 | Discharge: 2021-10-26 | Disposition: A | Discharge status: Home / Self Care | Payer: 59 | Source: Ambulatory Visit

## 2021-10-26 ENCOUNTER — Other Ambulatory Visit: Payer: Self-pay

## 2021-10-26 VITALS — BP 125/95 | HR 85 | Temp 98.7°F | Resp 18 | Ht 63.0 in | Wt 142.0 lb

## 2021-10-26 DIAGNOSIS — R21 Rash and other nonspecific skin eruption: Secondary | ICD-10-CM | POA: Diagnosis not present

## 2021-10-26 MED ORDER — CLOTRIMAZOLE-BETAMETHASONE 1-0.05 % EX CREA: TOPICAL_CREAM | CUTANEOUS | 0 refills | Status: AC

## 2021-10-26 MED ORDER — CLOTRIMAZOLE 1 % EX CREA: TOPICAL_CREAM | CUTANEOUS | 1 refills | Status: DC

## 2021-10-26 NOTE — ED Triage Notes (Signed)
Patient is here for "Rash" or "Blisters" under armpits. Has been about 10 days or so, not improving. No new products used on area. Rash not seen anywhere else.  ?

## 2021-10-26 NOTE — ED Provider Notes (Signed)
?[facility] ? ? ? ?CSN: [id number] ?Arrival date & time: 10/26/21  0840 ? ? ?  ? ?History   ?Chief Complaint ?Chief Complaint  ?Patient presents with  ? Rash  ?  Appt: 0900 am.  ? ? ?HPI ?Allison Brock is a 51 y.o. female presenting for itchy and painful rash of axillary region bilaterally for the past week and a half.  Denies any changes to topical skin products and no change to deodorants or detergents.  Patient reports she has a history of psoriasis that she had in her scalp and the pain feels similar to her.  She has treated with over-the-counter Benadryl and hydrocortisone cream without any improvement.  States it continues to get worse.  Other medical history significant for Crohn's and CVA. ? ?HPI ? ?Past Medical History:  ?Diagnosis Date  ? Crohn disease (HCC)   ? Seizures (HCC)   ? Stroke [facility])   ? ? ?Patient Active Problem List  ? Diagnosis Date Noted  ? Cerebrovascular accident [facility]) 06/25/2021  ? Chronic pain 11/03/2013  ? Chronic anticoagulation 02/22/2012  ? Iron deficiency anemia 02/22/2012  ? Chronic hypokalemia 02/22/2012  ? Headache, chronic daily 01/28/2012  ? Psoriasis 01/14/2012  ? Joint pain 01/14/2012  ? Crohn's disease (HCC) 06/19/2011  ? ? ?Past Surgical History:  ?Procedure Laterality Date  ? CESAREAN SECTION    ? ? ?OB History   ?No obstetric history on file. ?  ? ? ? ?Home Medications   ? ?Prior to Admission medications   ?Medication Sig Start Date End Date Taking? Authorizing Provider  ?Adalimumab 40 MG/0.8ML PNKT Inject into the skin. 08/24/12  Yes [provider]  ?clotrimazole-betamethasone (LOTRISONE) cream Apply to affected area 2 times daily prn 10/26/21 11/09/21 Yes Eusebio Friendly B, PA-C  ?cyanocobalamin (,VITAMIN B-12,) 1000 MCG/ML injection Inject into the muscle. 02/16/14  Yes [provider]  ?diphenhydrAMINE (BENADRYL) 25 MG tablet Take 25 mg by mouth every 6 (six) hours as needed. Last dose: 4 am.   Yes [provider]   ?HYDROcodone-acetaminophen (NORCO) 7.5-325 MG tablet Take 1 tablet by mouth 2 (two) times daily. 02/16/14  Yes [provider]  ?levETIRAcetam (KEPPRA) 500 MG tablet Take 1 tablet by mouth 2 (two) times daily. 08/07/14  Yes [provider]  ?methylphenidate (RITALIN) 20 MG tablet  09/25/14  Yes [provider]  ?norethindrone (ERRIN) 0.35 MG tablet Take 1 tablet by mouth daily. 08/22/12  Yes [provider]  ?QUEtiapine (SEROQUEL) 25 MG tablet Take 1-2 tablets by mouth at bedtime, as needed for mirgraines 04/19/14  Yes [provider]  ?rivaroxaban (XARELTO) 20 MG TABS tablet Take by mouth. 03/10/12  Yes [provider]  ?topiramate (TOPAMAX) 100 MG tablet Take by mouth. 09/13/14  Yes [provider]  ?ALPRAZolam Prudy Feeler) 1 MG tablet Take by mouth.    [provider]  ?azithromycin (ZITHROMAX) 250 MG tablet Take by mouth. 08/17/21   [provider]  ?chlorhexidine (PERIDEX) 0.12 % solution 15 mLs 2 (two) times daily. 07/21/21   [provider]  ?dexamethasone (DECADRON) 4 MG tablet Take by mouth. 08/17/21   [provider]  ?gabapentin (NEURONTIN) 100 MG capsule gabapentin 100 mg capsule ? Take 1 capsule 3 times a day by oral route as directed for 10 days.    [provider]  ?HYDROcodone-acetaminophen (NORCO) 5-325 MG tablet Norco 5 mg-325 mg tablet ? Take 1 tablet every 8 hours by oral route as needed for 5 days.  [provider]  ?ibuprofen (ADVIL) 800 MG tablet Take 800 mg by mouth every 8 (eight) hours. 08/17/21   [provider]  ?mometasone (ELOCON) 0.1 % cream mometasone 0.1 % topical cream    [provider]  ?ondansetron (ZOFRAN) 4 MG tablet SMARTSIG:1 Tablet(s) By Mouth Every 24 Hours PRN 07/22/21   [provider]  ?topiramate (TOPAMAX) 25 MG capsule Take by mouth.    [provider]  ?valACYclovir (VALTREX) 1000 MG tablet Valtrex 1 gram tablet ? Take 1 tablet every  8 hours by oral route as directed for 7 days.    [provider]  ?cimetidine (TAGAMET) 400 MG tablet Take 400 mg by mouth 2 (two) times daily.  10/04/20  [provider]  ? ? ?Family History ?Family History  ?Problem Relation Age of Onset  ? Heart attack Father   ? ? ?Social History ?Social History  ? ?Tobacco Use  ? Smoking status: Never  ? Smokeless tobacco: Never  ?Vaping Use  ? Vaping Use: Never used  ?Substance Use Topics  ? Alcohol use: No  ? Drug use: No  ? ? ? ?Allergies   ?Ivp dye [iodinated contrast media] and Iopamidol ? ? ?Review of Systems ?Review of Systems  ?Constitutional:  Negative for fatigue and fever.  ?Musculoskeletal:  Negative for arthralgias and joint swelling.  ?Skin:  Positive for color change and rash.  ? ? ?Physical Exam ?Triage Vital Signs ?ED Triage Vitals  ?Enc Vitals Group  ?   BP   ?   Pulse   ?   Resp   ?   Temp   ?   Temp src   ?   SpO2   ?   Weight   ?   Height   ?   Head Circumference   ?   Peak Flow   ?   Pain Score   ?   Pain Loc   ?   Pain Edu?   ?   Excl. in GC?   ? ?No data found. ? ?Updated Vital Signs ?BP (!) 125/95 (BP Location: Left Arm)   Pulse 85   Temp 98.7 ?F (37.1 ?C) (Oral)   Resp 18   Ht 5\' 3"  (1.6 m)   Wt 142 lb (64.4 kg)   SpO2 99%   BMI 25.15 kg/m?  ?  ? ?Physical Exam ?Vitals and nursing note reviewed.  ?Constitutional:   ?   General: She is not in acute distress. ?   Appearance: Normal appearance. She is not ill-appearing or toxic-appearing.  ?HENT:  ?   Head: Normocephalic and atraumatic.  ?Eyes:  ?   General: No scleral icterus.    ?   Right eye: No discharge.     ?   Left eye: No discharge.  ?   Conjunctiva/sclera: Conjunctivae normal.  ?Cardiovascular:  ?   Rate and Rhythm: Normal rate and regular rhythm.  ?   Heart sounds: Normal heart sounds.  ?Pulmonary:  ?   Effort: Pulmonary effort is normal. No respiratory distress.  ?   Breath sounds: Normal breath sounds.  ?Musculoskeletal:  ?   Cervical back: Neck supple.  ?Skin: ?    General: Skin is dry.  ?   Findings: Rash (well circumscribed ovular erythematous plaques bilateral axillary regions) present.  ?Neurological:  ?   General: No focal deficit present.  ?   Mental Status: She is alert. Mental status is at baseline.  ?   Motor: No weakness.  ?  Gait: Gait normal.  ?Psychiatric:     ?   Mood and Affect: Mood normal.     ?   Behavior: Behavior normal.     ?   Thought Content: Thought content normal.  ? ? ? ?UC Treatments / Results  ?Labs ?(all labs ordered are listed, but only abnormal results are displayed) ?Labs Reviewed - No data to display ? ?EKG ? ? ?Radiology ?No results found. ? ?Procedures ?Procedures (including critical care time) ? ?Medications Ordered in UC ?Medications - No data to display ? ?Initial Impression / Assessment and Plan / UC Course  ?I have reviewed the triage vital signs and the nursing notes. ? ?Pertinent labs & imaging results that were available during my care of the patient were reviewed by me and considered in my medical decision making (see chart for details). ? ?51 year old female presenting for painful and pruritic rash of bilateral axillary region.  Patient has history of psoriasis but has never had any sort of rash like this in her axillary region.  No change in skin products.  No improvement with Benadryl and hydrocortisone cream.  On exam she does have numerous well-circumscribed erythematous circular/ovular plaques that are dry of bilateral axillary regions.  Suspect fungal skin infection versus inverse psoriasis given appearance.  We will treat with Lotrisone to hopefully cover the possibility of both illnesses.  Advised patient if no improvement she may need to see dermatology to have the skin scraped and tested.  Reviewed return precautions. ? ? ?Final Clinical Impressions(s) / UC Diagnoses  ? ?Final diagnoses:  ?Rash and nonspecific skin eruption  ? ? ? ?Discharge Instructions   ? ?  ?-Rash may be consistent with a fungal process versus a  condition called inverse psoriasis.  I have sent a combination cream to treat this.  Clean with soap and water and pat dry.  Try not to scratch.  If this is not going away in the next 10 days or symptoms worsen, please return. ? ? ? ?

## 2021-10-26 NOTE — Discharge Instructions (Signed)
-  Rash may be consistent with a fungal process versus a condition called inverse psoriasis.  I have sent a combination cream to treat this.  Clean with soap and water and pat dry.  Try not to scratch.  If this is not going away in the next 10 days or symptoms worsen, please return. ?

## 2022-04-08 ENCOUNTER — Ambulatory Visit
Admission: RE | Admit: 2022-04-08 | Discharge: 2022-04-08 | Disposition: A | Discharge status: Home / Self Care | Payer: 59 | Source: Ambulatory Visit | Attending: Emergency Medicine | Admitting: Emergency Medicine

## 2022-04-08 VITALS — BP 168/88 | HR 65 | Temp 98.4°F | Resp 18 | Ht 63.0 in | Wt 140.0 lb

## 2022-04-08 DIAGNOSIS — H60392 Other infective otitis externa, left ear: Secondary | ICD-10-CM

## 2022-04-08 MED ORDER — NEOMYCIN-POLYMYXIN-HC 3.5-10000-1 OT SUSP: 4.0000 [drp] | Freq: Three times a day (TID) | OTIC | 0 refills | Status: DC

## 2022-04-08 NOTE — ED Provider Notes (Signed)
[facility]    CSN: [id number] Arrival date & time: 04/08/22  1655      History   Chief Complaint Chief Complaint  Patient presents with   Otalgia    Left    HPI Allison Brock is a 51 y.o. female.   HPI  51 year old female here for evaluation of left ear pain.  Patient reports that she has been experiencing pain in her left ear for the last 4 days along with muffled hearing.  She has had a bit of a runny nose as well and endorses sore throat on the left side of her throat that started today.  She denies any fever, nasal congestion, or cough.  She states that the skin around her left ear is very tender and even hurts to wear her glasses.  Past Medical History:  Diagnosis Date   Crohn disease (HCC)    Seizures (HCC)    Stroke [facility])     Patient Active Problem List   Diagnosis Date Noted   Cerebrovascular accident (HCC) 06/25/2021   Chronic pain 11/03/2013   Chronic anticoagulation 02/22/2012   Iron deficiency anemia 02/22/2012   Chronic hypokalemia 02/22/2012   Headache, chronic daily 01/28/2012   Psoriasis 01/14/2012   Joint pain 01/14/2012   Crohn's disease (HCC) 06/19/2011    Past Surgical History:  Procedure Laterality Date   CESAREAN SECTION      OB History   No obstetric history on file.      Home Medications    Prior to Admission medications   Medication Sig Start Date End Date Taking? Authorizing Provider  neomycin-polymyxin-hydrocortisone (CORTISPORIN) 3.5-10000-1 OTIC suspension Place 4 drops into the left ear 3 (three) times daily. 04/08/22  Yes [name], NP  Adalimumab 40 MG/0.8ML PNKT Inject into the skin. 08/24/12   [provider]  ALPRAZolam Prudy Feeler) 1 MG tablet Take by mouth.    [provider]  azithromycin (ZITHROMAX) 250 MG tablet Take by mouth. 08/17/21   [provider]  chlorhexidine (PERIDEX) 0.12 % solution 15 mLs 2 (two) times daily. 07/21/21   [provider]  cyanocobalamin  (,VITAMIN B-12,) 1000 MCG/ML injection Inject into the muscle. 02/16/14   [provider]  dexamethasone (DECADRON) 4 MG tablet Take by mouth. 08/17/21   [provider]  diphenhydrAMINE (BENADRYL) 25 MG tablet Take 25 mg by mouth every 6 (six) hours as needed. Last dose: 4 am.    [provider]  gabapentin (NEURONTIN) 100 MG capsule gabapentin 100 mg capsule  Take 1 capsule 3 times a day by oral route as directed for 10 days.    [provider]  HYDROcodone-acetaminophen (NORCO) 5-325 MG tablet Norco 5 mg-325 mg tablet  Take 1 tablet every 8 hours by oral route as needed for 5 days.    [provider]  HYDROcodone-acetaminophen (NORCO) 7.5-325 MG tablet Take 1 tablet by mouth 2 (two) times daily. 02/16/14   [provider]  ibuprofen (ADVIL) 800 MG tablet Take 800 mg by mouth every 8 (eight) hours. 08/17/21   [provider]  levETIRAcetam (KEPPRA) 500 MG tablet Take 1 tablet by mouth 2 (two) times daily. 08/07/14   [provider]  methylphenidate (RITALIN) 20 MG tablet  09/25/14   [provider]  mometasone (ELOCON) 0.1 % cream mometasone 0.1 % topical cream    [provider]  norethindrone (ERRIN) 0.35 MG tablet Take 1 tablet by mouth daily. 08/22/12   [provider]  ondansetron [facility])  4 MG tablet SMARTSIG:1 Tablet(s) By Mouth Every 24 Hours PRN 07/22/21   [provider]  QUEtiapine (SEROQUEL) 25 MG tablet Take 1-2 tablets by mouth at bedtime, as needed for mirgraines 04/19/14   [provider]  rivaroxaban (XARELTO) 20 MG TABS tablet Take by mouth. 03/10/12   [provider]  topiramate (TOPAMAX) 100 MG tablet Take by mouth. 09/13/14   [provider]  topiramate (TOPAMAX) 25 MG capsule Take by mouth.    [provider]  valACYclovir (VALTREX) 1000 MG tablet Valtrex 1 gram tablet  Take 1 tablet every 8 hours by oral route as directed for 7 days.     [provider]  cimetidine (TAGAMET) 400 MG tablet Take 400 mg by mouth 2 (two) times daily.  10/04/20  [provider]    Family History Family History  Problem Relation Age of Onset   Heart attack Father     Social History Social History   Tobacco Use   Smoking status: Never   Smokeless tobacco: Never  Vaping Use   Vaping Use: Never used  Substance Use Topics   Alcohol use: No   Drug use: No     Allergies   Ivp dye [iodinated contrast media] and Iopamidol   Review of Systems Review of Systems  Constitutional:  Negative for fever.  HENT:  Positive for ear pain, rhinorrhea and sore throat. Negative for congestion and ear discharge.   Respiratory:  Negative for cough.      Physical Exam Triage Vital Signs ED Triage Vitals  Enc Vitals Group     BP      Pulse      Resp      Temp      Temp src      SpO2      Weight      Height      Head Circumference      Peak Flow      Pain Score      Pain Loc      Pain Edu?      Excl. in GC?    No data found.  Updated Vital Signs BP (!) 168/88 (BP Location: Left Arm)   Pulse 65   Temp 98.4 F (36.9 C) (Oral)   Resp 18   Ht 5\' 3"  (1.6 m)   Wt 140 lb (63.5 kg)   SpO2 100%   BMI 24.80 kg/m   Visual Acuity Right Eye Distance:   Left Eye Distance:   Bilateral Distance:    Right Eye Near:   Left Eye Near:    Bilateral Near:     Physical Exam Vitals and nursing note reviewed.  Constitutional:      Appearance: Normal appearance. She is not ill-appearing.  HENT:     Head: Normocephalic and atraumatic.     Right Ear: Tympanic membrane, ear canal and external ear normal. There is no impacted cerumen.     Left Ear: Tympanic membrane and external ear normal. There is no impacted cerumen.     Mouth/Throat:     Mouth: Mucous membranes are moist.     Pharynx: Oropharynx is clear. No oropharyngeal exudate or posterior oropharyngeal erythema.  Cardiovascular:     Rate and Rhythm: Normal rate and  regular rhythm.     Pulses: Normal pulses.     Heart sounds: Normal heart sounds. No murmur heard.    No friction rub. No gallop.  Pulmonary:     Effort: Pulmonary  effort is normal.     Breath sounds: Normal breath sounds. No wheezing, rhonchi or rales.  Musculoskeletal:     Cervical back: Normal range of motion and neck supple.  Lymphadenopathy:     Cervical: No cervical adenopathy.  Skin:    General: Skin is warm and dry.     Capillary Refill: Capillary refill takes less than 2 seconds.     Findings: No erythema.  Neurological:     General: No focal deficit present.     Mental Status: She is alert and oriented to person, place, and time.  Psychiatric:        Mood and Affect: Mood normal.        Behavior: Behavior normal.        Thought Content: Thought content normal.        Judgment: Judgment normal.      UC Treatments / Results  Labs (all labs ordered are listed, but only abnormal results are displayed) Labs Reviewed - No data to display  EKG   Radiology No results found.  Procedures Procedures (including critical care time)  Medications Ordered in UC Medications - No data to display  Initial Impression / Assessment and Plan / UC Course  I have reviewed the triage vital signs and the nursing notes.  Pertinent labs & imaging results that were available during my care of the patient were reviewed by me and considered in my medical decision making (see chart for details).   Patient is a pleasant, nontoxic-appearing 51 year old female here for evaluation of left-sided sore throat and left ear pain as outlined in HPI above.  The ear pain is been going on for 4 days and sore throat that started today.  She does endorse a mild runny nose but no nasal congestion, fever, or cough.  On exam patient's left external auditory canal is markedly edematous and erythematous.  I Minna visualize the tympanic membrane which is pearly gray in appearance with normal light reflex.   Patient does endorse some pain with movement of the auricle of the ear.  The surrounding tissue of the ear and mastoid area are free of redness, heat, induration, or fluctuance.  Oropharyngeal exam is benign.  No anterior cervical lymphadenopathy appreciable exam.  I suspect that the patient's pain in the left side of her sore throat is radiating from her left ear.  Her exam is consistent with otitis externa and I will treat her with Cortisporin otic, 4 drops 3 times a day for 5 days.  I have also encouraged her to continue using Tylenol and ibuprofen as needed for pain.  She can also use a hot water bottle or heating pad on low underneath her pillowcase at night to dilate up the tissues and aid in pain relief.   Final Clinical Impressions(s) / UC Diagnoses   Final diagnoses:  Other infective acute otitis externa of left ear     Discharge Instructions      Take the Cortisporin otic three times daily for 7 days with food for treatment of your ear infection.  Take an over-the-counter probiotic 1 hour after each dose of antibiotic to prevent diarrhea.  Use over-the-counter Tylenol and ibuprofen as needed for pain or fever.  Place a hot water bottle, or heating pad, underneath your pillowcase at night to help dilate up your ear and aid in pain relief as well as resolution of the infection.  Return for reevaluation for any new or worsening symptoms.  ED Prescriptions     Medication Sig Dispense Auth. Provider   neomycin-polymyxin-hydrocortisone (CORTISPORIN) 3.5-10000-1 OTIC suspension Place 4 drops into the left ear 3 (three) times daily. 10 mL [name], NP      PDMP not reviewed this encounter.   [name], NP 04/08/22 1800

## 2022-04-08 NOTE — ED Triage Notes (Signed)
Left outer ear pain x day 4.   Sore throat x today.

## 2022-04-08 NOTE — Discharge Instructions (Signed)
Take the Cortisporin otic three times daily for 7 days with food for treatment of your ear infection.  Take an over-the-counter probiotic 1 hour after each dose of antibiotic to prevent diarrhea.  Use over-the-counter Tylenol and ibuprofen as needed for pain or fever.  Place a hot water bottle, or heating pad, underneath your pillowcase at night to help dilate up your ear and aid in pain relief as well as resolution of the infection.  Return for reevaluation for any new or worsening symptoms.

## 2022-04-17 ENCOUNTER — Ambulatory Visit
Admission: RE | Admit: 2022-04-17 | Discharge: 2022-04-17 | Disposition: A | Discharge status: Home / Self Care | Payer: 59 | Source: Ambulatory Visit | Attending: Family Medicine | Admitting: Family Medicine

## 2022-04-17 ENCOUNTER — Ambulatory Visit (INDEPENDENT_AMBULATORY_CARE_PROVIDER_SITE_OTHER)
Admit: 2022-04-17 | Discharge: 2022-04-17 | Disposition: A | Discharge status: Home / Self Care | Payer: 59 | Attending: Family Medicine | Admitting: Family Medicine

## 2022-04-17 VITALS — BP 126/87 | HR 67 | Temp 98.2°F | Resp 14 | Ht 63.0 in | Wt 140.0 lb

## 2022-04-17 DIAGNOSIS — R519 Headache, unspecified: Secondary | ICD-10-CM

## 2022-04-17 DIAGNOSIS — G43809 Other migraine, not intractable, without status migrainosus: Secondary | ICD-10-CM | POA: Diagnosis not present

## 2022-04-17 DIAGNOSIS — H66002 Acute suppurative otitis media without spontaneous rupture of ear drum, left ear: Secondary | ICD-10-CM

## 2022-04-17 DIAGNOSIS — R42 Dizziness and giddiness: Secondary | ICD-10-CM | POA: Diagnosis not present

## 2022-04-17 LAB — BASIC METABOLIC PANEL
Anion gap: 8 (ref 5–15)
BUN: 8 mg/dL (ref 6–20)
CO2: 26 mmol/L (ref 22–32)
Calcium: 9 mg/dL (ref 8.9–10.3)
Chloride: 107 mmol/L (ref 98–111)
Creatinine, Ser: 0.75 mg/dL (ref 0.44–1.00)
GFR, Estimated: >60 mL/min (ref >60)
Glucose, Bld: 98 mg/dL (ref 70–99)
Potassium: 3.2 mmol/L — ABNORMAL LOW (ref 3.5–5.1)
Sodium: 141 mmol/L (ref 135–145)

## 2022-04-17 LAB — CBC WITH DIFFERENTIAL/PLATELET
Abs Immature Granulocytes: 0.01 10*3/uL (ref 0.00–0.07)
Basophils Absolute: 0 10*3/uL (ref 0.0–0.1)
Basophils Relative: 1 %
Eosinophils Absolute: 0.1 10*3/uL (ref 0.0–0.5)
Eosinophils Relative: 1 %
HCT: 35.7 % — ABNORMAL LOW (ref 36.0–46.0)
Hemoglobin: 12.4 g/dL (ref 12.0–15.0)
Immature Granulocytes: 0 %
Lymphocytes Relative: 34 %
Lymphs Abs: 1.8 10*3/uL (ref 0.7–4.0)
MCH: 31.6 pg (ref 26.0–34.0)
MCHC: 34.7 g/dL (ref 30.0–36.0)
MCV: 91.1 fL (ref 80.0–100.0)
Monocytes Absolute: 0.4 10*3/uL (ref 0.1–1.0)
Monocytes Relative: 7 %
Neutro Abs: 3 10*3/uL (ref 1.7–7.7)
Neutrophils Relative %: 57 %
Platelets: 363 10*3/uL (ref 150–400)
RBC: 3.92 MIL/uL (ref 3.87–5.11)
RDW: 13.3 % (ref 11.5–15.5)
WBC: 5.3 10*3/uL (ref 4.0–10.5)
nRBC: 0 % (ref 0.0–0.2)

## 2022-04-17 MED ORDER — AMOXICILLIN 875 MG PO TABS: 875.0000 mg | ORAL_TABLET | Freq: Two times a day (BID) | ORAL | 0 refills | Status: AC

## 2022-04-17 MED ORDER — KETOROLAC TROMETHAMINE 60 MG/2ML IM SOLN
30.0000 mg | Freq: Once | INTRAMUSCULAR | Status: AC
  Administered 2022-04-17: 30 mg via INTRAMUSCULAR

## 2022-04-17 MED ORDER — PROCHLORPERAZINE MALEATE 5 MG PO TABS: 5.0000 mg | ORAL_TABLET | Freq: Four times a day (QID) | ORAL | 0 refills | Status: DC | PRN

## 2022-04-17 MED ORDER — PROMETHAZINE HCL 25 MG/ML IJ SOLN
25.0000 mg | Freq: Once | INTRAMUSCULAR | Status: AC
  Administered 2022-04-17: 25 mg via INTRAMUSCULAR

## 2022-04-17 MED ORDER — MECLIZINE HCL 25 MG PO TABS: 25.0000 mg | ORAL_TABLET | Freq: Three times a day (TID) | ORAL | 0 refills | Status: DC | PRN

## 2022-04-17 NOTE — ED Provider Notes (Signed)
[facility]    CSN: [id number] Arrival date & time: 04/17/22  1302      History   Chief Complaint Chief Complaint  Patient presents with   Dizziness    APPOINTMENT   Otalgia    HPI Allison Brock is a 51 y.o. female.   HPI  [name] presents for worsening left ear pain with new headache and dizziness.  She was seen here last week and treated for otitis externa with ear drops. Pt with history of migraines. She has not had a migraine "in a while." She has been having nausea and vomiting. Dizziness is room spinning and shaking.    Fever : no  Chills: no Sore throat: no   Cough: no Sputum: no Nasal congestion : no  Appetite: normal  Hydration: normal  Abdominal pain: no Nausea: no Vomiting: no Dysuria: no  Sleep disturbance: no Back Pain: no Headache: no   Past Medical History:  Diagnosis Date   Crohn disease (HCC)    Seizures (HCC)    Stroke [facility])     Patient Active Problem List   Diagnosis Date Noted   Cerebrovascular accident (HCC) 06/25/2021   Chronic pain 11/03/2013   Chronic anticoagulation 02/22/2012   Iron deficiency anemia 02/22/2012   Chronic hypokalemia 02/22/2012   Headache, chronic daily 01/28/2012   Psoriasis 01/14/2012   Joint pain 01/14/2012   Crohn's disease (HCC) 06/19/2011    Past Surgical History:  Procedure Laterality Date   CESAREAN SECTION      OB History   No obstetric history on file.      Home Medications    Prior to Admission medications   Medication Sig Start Date End Date Taking? Authorizing Provider  Adalimumab 40 MG/0.8ML PNKT Inject into the skin. 08/24/12   [provider]  ALPRAZolam Prudy Feeler) 1 MG tablet Take by mouth.    [provider]  azithromycin (ZITHROMAX) 250 MG tablet Take by mouth. 08/17/21   [provider]  chlorhexidine (PERIDEX) 0.12 % solution 15 mLs 2 (two) times daily. 07/21/21   [provider]  cyanocobalamin (,VITAMIN B-12,) 1000 MCG/ML  injection Inject into the muscle. 02/16/14   [provider]  dexamethasone (DECADRON) 4 MG tablet Take by mouth. 08/17/21   [provider]  diphenhydrAMINE (BENADRYL) 25 MG tablet Take 25 mg by mouth every 6 (six) hours as needed. Last dose: 4 am.    [provider]  gabapentin (NEURONTIN) 100 MG capsule gabapentin 100 mg capsule  Take 1 capsule 3 times a day by oral route as directed for 10 days.    [provider]  HYDROcodone-acetaminophen (NORCO) 5-325 MG tablet Norco 5 mg-325 mg tablet  Take 1 tablet every 8 hours by oral route as needed for 5 days.    [provider]  HYDROcodone-acetaminophen (NORCO) 7.5-325 MG tablet Take 1 tablet by mouth 2 (two) times daily. 02/16/14   [provider]  ibuprofen (ADVIL) 800 MG tablet Take 800 mg by mouth every 8 (eight) hours. 08/17/21   [provider]  levETIRAcetam (KEPPRA) 500 MG tablet Take 1 tablet by mouth 2 (two) times daily. 08/07/14   [provider]  methylphenidate (RITALIN) 20 MG tablet  09/25/14   [provider]  mometasone (ELOCON) 0.1 % cream mometasone 0.1 % topical cream    [provider]  neomycin-polymyxin-hydrocortisone (CORTISPORIN) 3.5-10000-1 OTIC suspension Place 4 drops into the left ear 3 (three) times daily. 04/08/22   [name], NP  norethindrone (  ERRIN) 0.35 MG tablet Take 1 tablet by mouth daily. 08/22/12   [provider]  ondansetron (ZOFRAN) 4 MG tablet SMARTSIG:1 Tablet(s) By Mouth Every 24 Hours PRN 07/22/21   [provider]  QUEtiapine (SEROQUEL) 25 MG tablet Take 1-2 tablets by mouth at bedtime, as needed for mirgraines 04/19/14   [provider]  rivaroxaban (XARELTO) 20 MG TABS tablet Take by mouth. 03/10/12   [provider]  topiramate (TOPAMAX) 100 MG tablet Take by mouth. 09/13/14   [provider]  topiramate (TOPAMAX) 25 MG capsule Take by mouth.    [provider]   valACYclovir (VALTREX) 1000 MG tablet Valtrex 1 gram tablet  Take 1 tablet every 8 hours by oral route as directed for 7 days.    [provider]  cimetidine (TAGAMET) 400 MG tablet Take 400 mg by mouth 2 (two) times daily.  10/04/20  [provider]    Family History Family History  Problem Relation Age of Onset   Heart attack Father     Social History Social History   Tobacco Use   Smoking status: Never   Smokeless tobacco: Never  Vaping Use   Vaping Use: Never used  Substance Use Topics   Alcohol use: No   Drug use: No     Allergies   Ivp dye [iodinated contrast media] and Iopamidol   Review of Systems Review of Systems : negative unless otherwise stated in HPI.      Physical Exam Triage Vital Signs ED Triage Vitals  Enc Vitals Group     BP 04/17/22 1313 126/87     Pulse Rate 04/17/22 1313 67     Resp 04/17/22 1313 14     Temp 04/17/22 1313 98.2 F (36.8 C)     Temp Source 04/17/22 1313 Oral     SpO2 04/17/22 1313 100 %     Weight 04/17/22 1311 140 lb (63.5 kg)     Height 04/17/22 1311 5\' 3"  (1.6 m)     Head Circumference --      Peak Flow --      Pain Score 04/17/22 1311 7     Pain Loc --      Pain Edu? --      Excl. in GC? --    No data found.  Updated Vital Signs BP 126/87 (BP Location: Right Arm)   Pulse 67   Temp 98.2 F (36.8 C) (Oral)   Resp 14   Ht 5\' 3"  (1.6 m)   Wt 63.5 kg   LMP 07/27/2017 Comment: denies preg  SpO2 100%   BMI 24.80 kg/m   Visual Acuity Right Eye Distance:   Left Eye Distance:   Bilateral Distance:    Right Eye Near:   Left Eye Near:    Bilateral Near:     Physical Exam  GEN: alert, well appearing female, in no acute distress ***   HENT:  mucus membranes moist, oropharyngeal without lesions or erythema ***,  nares patent, no nasal discharge *** EYES:   pupils equal and reactive, EOM intact NECK:  supple, normal ROM, no lymphadenopathy *** RESP:  clear to auscultation bilaterally, no  increased work of breathing *** CVS:   regular rate and rhythm, no murmur, distal pulses intact  *** ABD:  soft, non-tender; bowel sounds present; no palpable masses,  *** GU:  normal female/female *** EXT:   normal ROM, atraumatic, no edema *** NEURO:  normal without focal findings,  speech normal, alert  and oriented  *** Skin:   warm and dry, no rash***, normal*** skin turgor Psych: Normal affect, appropriate speech and behavior    UC Treatments / Results  Labs (all labs ordered are listed, but only abnormal results are displayed) Labs Reviewed - No data to display  EKG   Radiology No results found.  Procedures Procedures (including critical care time)  Medications Ordered in UC Medications  promethazine (PHENERGAN) injection 25 mg (has no administration in time range)  ketorolac (TORADOL) injection 30 mg (has no administration in time range)    Initial Impression / Assessment and Plan / UC Course  I have reviewed the triage vital signs and the nursing notes.  Pertinent labs & imaging results that were available during my care of the patient were reviewed by me and considered in my medical decision making (see chart for details).  Clinical Course as of 04/17/22 1718  Fri Apr 17, 2022  1506 Prior Auth from insurance obtained.  Updated family.  Patient resting in a dark room with her eyes closed.  She continues to have head pain but dizziness has improved as she is sitting still. [VB]    Clinical Course User Index [VB] Lacole Komorowski, DO    Level 5 *** Final Clinical Impressions(s) / UC Diagnoses   Final diagnoses:  None   Discharge Instructions   None    ED Prescriptions   None    PDMP not reviewed this encounter.

## 2022-04-17 NOTE — Discharge Instructions (Addendum)
Stop by the pharmacy to pick up your prescriptions.  For your headache: Take your antinausea medicine, Compazine, with 1 tablet of Benadryl 25 mg, with 2 tablets of Tylenol and 3 tablets of ibuprofen.  You can repeat this every 6 hours as needed  For your ear: Take your antibiotics twice a day.  It be a good idea to take a probiotics and/or eat yogurt.  For dizziness: Your head imaging was normal.  As your dizziness changes with your position it may be vertigo.  We gave you a trial of meclizine.  If your headache acutely gets worse or does not improve with treatment go to the emergency department.  Additionally, if your ear pain and dizziness do not improve go to the emergency department.

## 2022-04-17 NOTE — ED Notes (Signed)
Prior Auth for CT head w/o contrast  Auth [id number]

## 2022-04-17 NOTE — ED Triage Notes (Signed)
Patient c/o left ear pain that started yesterday.  Patient states that she did have an ear infection over a week ago.  Patient states that she woke up this morning with dizziness and nausea.

## 2022-12-19 ENCOUNTER — Ambulatory Visit (INDEPENDENT_AMBULATORY_CARE_PROVIDER_SITE_OTHER): Payer: 59

## 2022-12-19 ENCOUNTER — Ambulatory Visit
Admission: EM | Admit: 2022-12-19 | Discharge: 2022-12-19 | Disposition: A | Discharge status: Home / Self Care | Payer: 59 | Attending: Emergency Medicine | Admitting: Emergency Medicine

## 2022-12-19 DIAGNOSIS — M1990 Unspecified osteoarthritis, unspecified site: Secondary | ICD-10-CM

## 2022-12-19 DIAGNOSIS — Z23 Encounter for immunization: Secondary | ICD-10-CM

## 2022-12-19 DIAGNOSIS — W5501XA Bitten by cat, initial encounter: Secondary | ICD-10-CM

## 2022-12-19 MED ORDER — AMOXICILLIN-POT CLAVULANATE 875-125 MG PO TABS: 1.0000 | ORAL_TABLET | Freq: Two times a day (BID) | ORAL | 0 refills | Status: AC

## 2022-12-19 MED ORDER — METHYLPREDNISOLONE 4 MG PO TBPK: ORAL_TABLET | ORAL | 0 refills | Status: DC

## 2022-12-19 MED ORDER — TETANUS-DIPHTH-ACELL PERTUSSIS 5-2.5-18.5 LF-MCG/0.5 IM SUSY
0.5000 mL | PREFILLED_SYRINGE | Freq: Once | INTRAMUSCULAR | Status: AC
  Administered 2022-12-19: 0.5 mL via INTRAMUSCULAR

## 2022-12-19 NOTE — ED Provider Notes (Signed)
[facility] [place]   Name: Allison Brock DOB: 23-Nov-1970 MRN: [id number] CSN: [id number] PCP: Pcp, No  Arrival date and time:  12/19/22 1435  Chief Complaint:  Joint Swelling   NOTE: Prior to seeing the patient today, I have reviewed the triage nursing documentation and vital signs. Clinical staff has updated patient's PMH/PSHx, current medication list, and drug allergies/intolerances to ensure comprehensive history available to assist in medical decision making.   History:   HPI: Allison Brock is a 52 y.o. female who presents today with complaints of joint swelling x 48 hours.  Patient has noticed swelling over the past week, but has progressively gotten worse over the past 3 days.  She denies any trauma to her hand.  She has tried over-the-counter Tylenol and Aleve but has noted little to no relief.  She does have a few cat scratches/bite to that area.  These are from her personal cat who is an indoor cat only and has been vaccinated.  She denies any systemic symptoms.   Past Medical History:  Diagnosis Date   Crohn disease (HCC)    Seizures (HCC)    Stroke [facility])     Past Surgical History:  Procedure Laterality Date   CESAREAN SECTION      Family History  Problem Relation Age of Onset   Heart attack Father     Social History   Tobacco Use   Smoking status: Never   Smokeless tobacco: Never  Vaping Use   Vaping Use: Never used  Substance Use Topics   Alcohol use: No   Drug use: No    Patient Active Problem List   Diagnosis Date Noted   Cerebrovascular accident (HCC) 06/25/2021   Chronic pain 11/03/2013   Chronic anticoagulation 02/22/2012   Iron deficiency anemia 02/22/2012   Chronic hypokalemia 02/22/2012   Headache, chronic daily 01/28/2012   Psoriasis 01/14/2012   Joint pain 01/14/2012   Crohn's disease (HCC) 06/19/2011    Home Medications:    No outpatient medications have been marked as taking for the 12/19/22 encounter  [facility] Encounter).    Allergies:   Ivp dye [iodinated contrast media] and Iopamidol  Review of Systems (ROS): Review of Systems  Constitutional:  Negative for chills, fatigue and fever.  Musculoskeletal:  Positive for arthralgias and joint swelling. Negative for myalgias.  Skin:  Positive for rash.     Vital Signs: Today's Vitals   12/19/22 1452 12/19/22 1453 12/19/22 1455  BP:   130/83  Pulse:   87  Temp:   98.3 F (36.8 C)  TempSrc:   Oral  SpO2:   97%  Weight:  139 lb (63 kg)   Height:  5\' 3"  (1.6 m)   PainSc: 6       Physical Exam: Physical Exam Vitals and nursing note reviewed.  Constitutional:      Appearance: Normal appearance.  Cardiovascular:     Rate and Rhythm: Normal rate and regular rhythm.     Pulses: Normal pulses.     Heart sounds: Normal heart sounds.  Pulmonary:     Breath sounds: Normal breath sounds.  Musculoskeletal:     Right hand: Tenderness and bony tenderness present. Decreased range of motion. Normal pulse.     Comments: There is a rash to the second MCP joint of the right hand.  There is visible swelling to the joint as well.  Skin:    General: Skin is warm and dry.  Neurological:  Mental Status: She is alert.  Psychiatric:        Mood and Affect: Mood normal.        Behavior: Behavior normal.        Thought Content: Thought content normal.      Urgent Care Treatments / Results:   LABS: PLEASE NOTE: all labs that were ordered this encounter are listed, however only abnormal results are displayed. Labs Reviewed - No data to display  EKG: -None  RADIOLOGY: DG Hand Complete Right  Result Date: 12/19/2022 CLINICAL DATA:  Patient complaining of right second knuckle swelling and pain. EXAM: RIGHT HAND - COMPLETE 3+ VIEW COMPARISON:  None Available. FINDINGS: There is no evidence of fracture or dislocation. There is no evidence of arthropathy or other focal bone abnormality. Soft tissues are unremarkable. IMPRESSION: Negative.  Electronically Signed   By: Emmaline Kluver M.D.   On: 12/19/2022 16:02    PROCEDURES: Procedures  MEDICATIONS RECEIVED THIS VISIT: Medications - No data to display  PERTINENT CLINICAL COURSE NOTES/UPDATES:   Initial Impression / Assessment and Plan / Urgent Care Course:  Pertinent labs & imaging results that were available during my care of the patient were personally reviewed by me and considered in my medical decision making (see lab/imaging section of note for values and interpretations).  Allison Brock is a 52 y.o. female who presents to [facility] today with complaints of joint swelling, diagnosed with arthritis and cat bites, and treated as such with the medications below. NP and patient reviewed discharge instructions below during visit.  Additional differentials include psoriatic arthritis.  Patient instructed follow-up with primary care provider for further evaluation of this disease as she has a history of Crohn's and psoriasis.  Patient is well appearing overall in clinic today. She does not appear to be in any acute distress. Presenting symptoms (see HPI) and exam as documented above.   I have reviewed the follow up and strict return precautions for any new or worsening symptoms. Patient is aware of symptoms that would be deemed urgent/emergent, and would thus require further evaluation either here or in the emergency department. At the time of discharge, she verbalized understanding and consent with the discharge plan as it was reviewed with her. All questions were fielded by provider and/or clinic staff prior to patient discharge.    Final Clinical Impressions / Urgent Care Diagnoses:   Final diagnoses:  Cat bite, initial encounter  Arthritis    New Prescriptions:  [place] Controlled Substance Registry consulted? Not Applicable  Meds ordered this encounter  Medications   amoxicillin-clavulanate (AUGMENTIN) 875-125 MG tablet    Sig: Take 1 tablet by mouth every  12 (twelve) hours for 3 days.    Dispense:  6 tablet    Refill:  0   methylPREDNISolone (MEDROL DOSEPAK) 4 MG TBPK tablet    Sig: Take as instructed on package.    Dispense:  1 each    Refill:  0   Tdap (BOOSTRIX) injection 0.5 mL      Discharge Instructions      You were seen for joint swelling and are being treated for arthritis and cat scratches.   - Take the antibiotics as prescribed until they're finished. If you think you're having a reaction, stop the medication, take benadryl and go to the nearest urgent care/emergency room. Take a probiotic while taking the antibiotic to decrease the chances of stomach upset.  -Keep the skin above your joint swelling moisturized. -Take your steroids as  directed. -If the swelling does not subside or worsens, follow-up with your primary care provider for further evaluation.  Take care, Dr. Sharlet Salina, NP-c      Recommended Follow up Care:  Patient encouraged to follow up with the following provider within the specified time frame, or sooner as dictated by the severity of her symptoms. As always, she was instructed that for any urgent/emergent care needs, she should seek care either here or in the emergency department for more immediate evaluation.   Bailey Mech, DNP, NP-c   Bailey Mech, NP 12/19/22 1620

## 2022-12-19 NOTE — ED Triage Notes (Signed)
PT c/o right 2nd knuckle swelling and pain.  Pt has small scratches around her wrist and thumb that she states are cat scratches  Pt has taken aleve and tylenol for symptoms  Pt can not bend her finger and states that she feels a steady throb from the knuckle.

## 2022-12-19 NOTE — Discharge Instructions (Signed)
You were seen for joint swelling and are being treated for arthritis and cat scratches.   - Take the antibiotics as prescribed until they're finished. If you think you're having a reaction, stop the medication, take benadryl and go to the nearest urgent care/emergency room. Take a probiotic while taking the antibiotic to decrease the chances of stomach upset.  -Keep the skin above your joint swelling moisturized. -Take your steroids as directed. -If the swelling does not subside or worsens, follow-up with your primary care provider for further evaluation.  Take care, Dr. Sharlet Salina, NP-c

## 2023-10-14 ENCOUNTER — Ambulatory Visit
Admission: EM | Admit: 2023-10-14 | Discharge: 2023-10-14 | Disposition: A | Discharge status: Home / Self Care | Attending: Physician Assistant | Admitting: Physician Assistant

## 2023-10-14 DIAGNOSIS — R112 Nausea with vomiting, unspecified: Secondary | ICD-10-CM | POA: Insufficient documentation

## 2023-10-14 DIAGNOSIS — E876 Hypokalemia: Secondary | ICD-10-CM | POA: Insufficient documentation

## 2023-10-14 DIAGNOSIS — R1010 Upper abdominal pain, unspecified: Secondary | ICD-10-CM | POA: Diagnosis present

## 2023-10-14 DIAGNOSIS — R5383 Other fatigue: Secondary | ICD-10-CM | POA: Diagnosis not present

## 2023-10-14 DIAGNOSIS — R197 Diarrhea, unspecified: Secondary | ICD-10-CM | POA: Diagnosis present

## 2023-10-14 DIAGNOSIS — Z8719 Personal history of other diseases of the digestive system: Secondary | ICD-10-CM | POA: Insufficient documentation

## 2023-10-14 LAB — CBC WITH DIFFERENTIAL/PLATELET
Abs Immature Granulocytes: 0.04 10*3/uL (ref 0.00–0.07)
Basophils Absolute: 0 10*3/uL (ref 0.0–0.1)
Basophils Relative: 0 %
Eosinophils Absolute: 0.2 10*3/uL (ref 0.0–0.5)
Eosinophils Relative: 2 %
HCT: 40.9 % (ref 36.0–46.0)
Hemoglobin: 14.3 g/dL (ref 12.0–15.0)
Immature Granulocytes: 0 %
Lymphocytes Relative: 4 %
Lymphs Abs: 0.5 10*3/uL — ABNORMAL LOW (ref 0.7–4.0)
MCH: 29.7 pg (ref 26.0–34.0)
MCHC: 35 g/dL (ref 30.0–36.0)
MCV: 85 fL (ref 80.0–100.0)
Monocytes Absolute: 0.3 10*3/uL (ref 0.1–1.0)
Monocytes Relative: 3 %
Neutro Abs: 10.4 10*3/uL — ABNORMAL HIGH (ref 1.7–7.7)
Neutrophils Relative %: 91 %
Platelets: 368 10*3/uL (ref 150–400)
RBC: 4.81 MIL/uL (ref 3.87–5.11)
RDW: 13.7 % (ref 11.5–15.5)
WBC: 11.3 10*3/uL — ABNORMAL HIGH (ref 4.0–10.5)
nRBC: 0 % (ref 0.0–0.2)

## 2023-10-14 LAB — COMPREHENSIVE METABOLIC PANEL
ALT: 22 U/L (ref 0–44)
AST: 24 U/L (ref 15–41)
Albumin: 4.6 g/dL (ref 3.5–5.0)
Alkaline Phosphatase: 117 U/L (ref 38–126)
Anion gap: 16 — ABNORMAL HIGH (ref 5–15)
BUN: 9 mg/dL (ref 6–20)
CO2: 21 mmol/L — ABNORMAL LOW (ref 22–32)
Calcium: 9.6 mg/dL (ref 8.9–10.3)
Chloride: 99 mmol/L (ref 98–111)
Creatinine, Ser: 0.88 mg/dL (ref 0.44–1.00)
GFR, Estimated: >60 mL/min (ref >60)
Glucose, Bld: 102 mg/dL — ABNORMAL HIGH (ref 70–99)
Potassium: 3.2 mmol/L — ABNORMAL LOW (ref 3.5–5.1)
Sodium: 136 mmol/L (ref 135–145)
Total Bilirubin: 2.6 mg/dL — ABNORMAL HIGH (ref 0.0–1.2)
Total Protein: 8.8 g/dL — ABNORMAL HIGH (ref 6.5–8.1)

## 2023-10-14 LAB — LIPASE, BLOOD: Lipase: 23 U/L (ref 11–51)

## 2023-10-14 MED ORDER — PANTOPRAZOLE SODIUM 20 MG PO TBEC: 20.0000 mg | DELAYED_RELEASE_TABLET | Freq: Every day | ORAL | 0 refills | Status: AC | PRN

## 2023-10-14 MED ORDER — ONDANSETRON HCL 4 MG/2ML IJ SOLN
8.0000 mg | Freq: Once | INTRAMUSCULAR | Status: AC
  Administered 2023-10-14: 8 mg via INTRAMUSCULAR

## 2023-10-14 MED ORDER — FAMOTIDINE 40 MG PO TABS
40.0000 mg | ORAL_TABLET | Freq: Once | ORAL | Status: AC
  Administered 2023-10-14: 40 mg via ORAL

## 2023-10-14 MED ORDER — POTASSIUM CHLORIDE CRYS ER 20 MEQ PO TBCR: 20.0000 meq | EXTENDED_RELEASE_TABLET | Freq: Two times a day (BID) | ORAL | 0 refills | Status: AC

## 2023-10-14 MED ORDER — ALUM & MAG HYDROXIDE-SIMETH 200-200-20 MG/5ML PO SUSP
30.0000 mL | Freq: Once | ORAL | Status: AC
  Administered 2023-10-14: 30 mL via ORAL

## 2023-10-14 MED ORDER — ONDANSETRON 4 MG PO TBDP: 4.0000 mg | ORAL_TABLET | Freq: Four times a day (QID) | ORAL | 0 refills | Status: AC | PRN

## 2023-10-14 MED ORDER — LIDOCAINE VISCOUS HCL 2 % MT SOLN
15.0000 mL | Freq: Once | OROMUCOSAL | Status: AC
  Administered 2023-10-14: 15 mL via ORAL

## 2023-10-14 NOTE — Discharge Instructions (Addendum)
-  Your white blood cell count is a little bit elevated.  You also have a little elevation in your bilirubin level but your liver enzymes are normal.  I cannot rule out a problem with your gallbladder at this time so if you are abdominal discomfort worsens or is not improving in the next 24 hours he should go to the emergency department for imaging. - Symptoms could potentially be related to stomach virus or gastritis/GERD.  Potassium a little low related to the diarrhea so I sent potassium to the pharmacy as well as an acid reducer and nausea medication.  You can also take Tylenol.  Make sure you are increasing fluid intake.  Consider Pedialyte.  I know you do not go to the ER but please have a very low threshold to go to the ER if the symptoms are not getting better sooner if they worsen.  ABDOMINAL PAIN: You may take Tylenol for pain relief. Use medications as directed including antiemetics and antidiarrheal medications if suggested or prescribed. You should increase fluids and electrolytes as well as rest over these next several days. If you have any questions or concerns, or if your symptoms are not improving or if especially if they acutely worsen, please call or stop back to the clinic immediately and we will be happy to help you or go to the ER   ABDOMINAL PAIN RED FLAGS: Seek immediate further care if: symptoms remain the same or worsen over the next 3-7 days, you are unable to keep fluids down, you see blood or mucus in your stool, you vomit black or dark red material, you have a fever of 101.F or higher, you have localized and/or persistent abdominal pain

## 2023-10-14 NOTE — ED Triage Notes (Signed)
 Pt c/o diarrhea, vomiting, fatigue x1day  Pt denies any new foods, activities, or medications  Pt states that she had a headache yesterday morning that is still present.   Pt states that her hands "feel funny" and they are tight.   Pt denies any new cuts or injuries.  Pt states that she can not drink water without vomiting.   Pt states that she has chrons disease  Pt states that she is taking no prescription medication.

## 2023-10-14 NOTE — ED Provider Notes (Signed)
 [facility]    CSN: [id number] Arrival date & time: 10/14/23  1635      History   Chief Complaint Chief Complaint  Patient presents with   Abdominal Pain   Headache    HPI Allison Brock is a 53 y.o. female with history of Crohn's disease, seizures, previous CVA, chronic pain, and psoriasis. Patient says the only medication she is on is Tylenol as needed and a multivitamin. States Crohn's has been in remission for years.  Today, she reports approximately 1 day history of upper abdominal cramping with associated nausea/vomiting, diarrhea, decreased p.o. intake.  Has also had some chills and fatigue.  Reports cramping in her legs today.  Has tried to take medication over-the-counter but has had bouts of emesis and unable to hold down any medicine.  Denies fever, cough, congestion, sore throat, difficulty swallowing, chest pain, wheezing or shortness of breath, palpitations, constipation, blood in stool, dysuria, frequency, urgency, flank pain.  Denies any sick contacts.  No recent travel.  Patient does not believe her symptoms are related to Crohn's and states she has had similar symptoms in the past related to gastritis.  She was seen in the ER nearly 1 year ago for similar symptoms and had negative CT scan.  Treated for gastritis and got better.  HPI  Past Medical History:  Diagnosis Date   Crohn disease (HCC)    Seizures (HCC)    Stroke [facility])     Patient Active Problem List   Diagnosis Date Noted   Cerebrovascular accident (HCC) 06/25/2021   Chronic pain 11/03/2013   Chronic anticoagulation 02/22/2012   Iron deficiency anemia 02/22/2012   Chronic hypokalemia 02/22/2012   Headache, chronic daily 01/28/2012   Psoriasis 01/14/2012   Joint pain 01/14/2012   Crohn's disease (HCC) 06/19/2011    Past Surgical History:  Procedure Laterality Date   CESAREAN SECTION      OB History   No obstetric history on file.      Home Medications    Prior to  Admission medications   Medication Sig Start Date End Date Taking? Authorizing Provider  acetaminophen (TYLENOL) 500 MG tablet Take 500 mg by mouth every 6 (six) hours as needed for moderate pain (pain score 4-6).   Yes [provider]  Multiple Vitamin (MULTIVITAMIN) tablet Take 1 tablet by mouth daily.   Yes [provider]  ondansetron (ZOFRAN-ODT) 4 MG disintegrating tablet Take 1 tablet (4 mg total) by mouth every 6 (six) hours as needed for vomiting or nausea. 10/14/23  Yes Eusebio Friendly B, PA-C  pantoprazole (PROTONIX) 20 MG tablet Take 1 tablet (20 mg total) by mouth daily as needed for heartburn or indigestion. 10/14/23 11/13/23 Yes Eusebio Friendly B, PA-C  potassium chloride SA (KLOR-CON M) 20 MEQ tablet Take 1 tablet (20 mEq total) by mouth 2 (two) times daily for 3 days. 10/14/23 10/17/23 Yes Shirlee Latch, PA-C  cimetidine (TAGAMET) 400 MG tablet Take 400 mg by mouth 2 (two) times daily.  10/04/20  [provider]    Family History Family History  Problem Relation Age of Onset   Heart attack Father     Social History Social History   Tobacco Use   Smoking status: Never   Smokeless tobacco: Never  Vaping Use   Vaping status: Never Used  Substance Use Topics   Alcohol use: No   Drug use: No     Allergies   Ivp dye [iodinated contrast media] and Iopamidol  Review of Systems Review of Systems  Constitutional:  Positive for appetite change, chills and fatigue. Negative for diaphoresis and fever.  HENT:  Negative for congestion, ear pain, rhinorrhea and sore throat.   Respiratory:  Negative for cough and shortness of breath.   Cardiovascular:  Negative for chest pain.  Gastrointestinal:  Positive for abdominal pain, diarrhea, nausea and vomiting. Negative for abdominal distention, anal bleeding, blood in stool and rectal pain.  Musculoskeletal:  Negative for myalgias.  Skin:  Negative for rash.  Neurological:  Negative for weakness and headaches.      Physical Exam Triage Vital Signs ED Triage Vitals  Encounter Vitals Group     BP --      Systolic BP Percentile --      Diastolic BP Percentile --      Pulse Rate 10/14/23 1718 (!) (P) 115     Resp --      Temp 10/14/23 1718 (P) 99.3 F (37.4 C)     Temp Source 10/14/23 1718 (P) Oral     SpO2 10/14/23 1718 (P) 98 %     Weight 10/14/23 1717 156 lb (70.8 kg)     Height 10/14/23 1717 5\' 3"  (1.6 m)     Head Circumference --      Peak Flow --      Pain Score 10/14/23 1717 8     Pain Loc --      Pain Education --      Exclude from Growth Chart --    No data found.  Updated Vital Signs Pulse (!) (P) 115   Temp (P) 99.3 F (37.4 C) (Oral)   Ht 5\' 3"  (1.6 m)   Wt 156 lb (70.8 kg)   LMP 07/27/2017 Comment: denies preg  SpO2 (P) 98%   BMI 27.63 kg/m    Physical Exam Vitals and nursing note reviewed.  Constitutional:      General: She is not in acute distress.    Appearance: Normal appearance. She is well-developed. She is ill-appearing. She is not toxic-appearing.  HENT:     Head: Normocephalic and atraumatic.     Nose: Nose normal.     Mouth/Throat:     Mouth: Mucous membranes are moist.     Pharynx: Oropharynx is clear.  Eyes:     General: No scleral icterus.       Right eye: No discharge.        Left eye: No discharge.     Conjunctiva/sclera: Conjunctivae normal.  Cardiovascular:     Rate and Rhythm: Regular rhythm. Tachycardia present.     Heart sounds: Normal heart sounds.  Pulmonary:     Effort: Pulmonary effort is normal. No respiratory distress.     Breath sounds: Normal breath sounds.  Abdominal:     Palpations: Abdomen is soft.     Tenderness: There is abdominal tenderness in the right upper quadrant, epigastric area and left upper quadrant. There is no right CVA tenderness, left CVA tenderness, guarding or rebound.  Musculoskeletal:     Cervical back: Neck supple.  Skin:    General: Skin is dry.  Neurological:     General: No focal deficit  present.     Mental Status: She is alert. Mental status is at baseline.     Motor: No weakness.     Gait: Gait normal.  Psychiatric:        Mood and Affect: Mood normal.        Behavior: Behavior normal.  UC Treatments / Results  Labs (all labs ordered are listed, but only abnormal results are displayed) Labs Reviewed  COMPREHENSIVE METABOLIC PANEL - Abnormal; Notable for the following components:      Result Value   Potassium 3.2 (*)    CO2 21 (*)    Glucose, Bld 102 (*)    Total Protein 8.8 (*)    Total Bilirubin 2.6 (*)    Anion gap 16 (*)    All other components within normal limits  CBC WITH DIFFERENTIAL/PLATELET - Abnormal; Notable for the following components:   WBC 11.3 (*)    Neutro Abs 10.4 (*)    Lymphs Abs 0.5 (*)    All other components within normal limits  LIPASE, BLOOD    EKG   Radiology No results found.  Procedures Procedures (including critical care time)  Medications Ordered in UC Medications  ondansetron (ZOFRAN) injection 8 mg (8 mg Intramuscular Given 10/14/23 1803)  alum & mag hydroxide-simeth (MAALOX/MYLANTA) 200-200-20 MG/5ML suspension 30 mL (30 mLs Oral Given 10/14/23 1758)    And  lidocaine (XYLOCAINE) 2 % viscous mouth solution 15 mL (15 mLs Oral Given 10/14/23 1758)  famotidine (PEPCID) tablet 40 mg (40 mg Oral Given 10/14/23 1803)    Initial Impression / Assessment and Plan / UC Course  I have reviewed the triage vital signs and the nursing notes.  Pertinent labs & imaging results that were available during my care of the patient were reviewed by me and considered in my medical decision making (see chart for details).   53 year old female presents for evaluation of upper abdominal pain with nausea, diarrhea, and decreased PO intake that  began today. Has had worsening cramping abdominal pain and chills.  History of Crohn's but states her Crohn's has been in remission for years and she does not feel that her symptoms are related to  Crohn's.  She says her abdominal pain is not as bad as when she has had Crohn's.   On exam patient appears uncomfortable and vitals notable for mild tachycardia with heart rate to 115, respirations of 20. No abnormal HEENT findings, clear lung sounds auscultation all fields, significant tenderness to palpation across upper abdomen with right upper quadrant, epigastric, left upper quadrant tenderness without guarding and rebound tenderness. No right lower quadrant tenderness or left lower quadrant tenderness. No CVA tenderness.. Cardiac exam unremarkable with exception of the tachycardia and a normal S1-S2 with no notable arrhythmias, abnormal beats, murmurs.   Differentials include gastritis versus peptic ulcer versus cholecystitis versus pancreatitis versus Crohn's exacerbation versus other infectious causes/viral gastroenteritis.   Discussed with patient that we are limited in urgent care.  Advised him I be better for her to go to the ER where she can have IV fluids, better pain management and CT imaging of necessary.  Patient wants to avoid going to ED and would like [place] to try everything here we can get her feeling better.  Explained that I can obtain lab work and we can defer medication but if she is not improving or labs suggest significant abnormalities we may advise her to go to the emergency department.  Patient is understanding and agreeable.  Obtain CBC, CMP, lipase.  Patient was given 40 mg oral famotidine, 8 mg IM Zofran, GI cocktail.  Reevaluation of patient after medications given.  Patient reports improvement in her symptoms.  Currently noting 5 out of 10 abdominal discomfort.  CBC shows elevated WBC count mildly at 11.3.  Lipase normal.  CMP shows  potassium decreased to 3.2, slightly elevated glucose of 102 and elevated bilirubin of 2.6.  Reviewed all results with patient.  Advised patient I could not work her up any further.  Explained symptoms could be related to viral gastroenteritis  or gastritis/GERD.  She still does not want to go to the ER especially since she is feeling little bit better.  Sent Zofran, pantoprazole and potassium x 3 days to pharmacy.  Advised increasing fluid intake.  Advised something with electrolytes such as Pedialyte.  Encouraged Tylenol as needed for pain.  Advised to have a low threshold to go to the ED if she is not beginning to improve in the next 24 hours, develops fever or any acute worsening of symptoms.  Explained that she will need to have CT imaging performed if she is not improving her symptoms get worse.  Patient is understanding and agreeable to plan.   Final Clinical Impressions(s) / UC Diagnoses   Final diagnoses:  Upper abdominal pain  Nausea vomiting and diarrhea  Hypokalemia  Other fatigue  History of Crohn's disease     Discharge Instructions      -Your white blood cell count is a little bit elevated.  You also have a little elevation in your bilirubin level but your liver enzymes are normal.  I cannot rule out a problem with your gallbladder at this time so if you are abdominal discomfort worsens or is not improving in the next 24 hours he should go to the emergency department for imaging. - Symptoms could potentially be related to stomach virus or gastritis/GERD.  Potassium a little low related to the diarrhea so I sent potassium to the pharmacy as well as an acid reducer and nausea medication.  You can also take Tylenol.  Make sure you are increasing fluid intake.  Consider Pedialyte.  I know you do not go to the ER but please have a very low threshold to go to the ER if the symptoms are not getting better sooner if they worsen.  ABDOMINAL PAIN: You may take Tylenol for pain relief. Use medications as directed including antiemetics and antidiarrheal medications if suggested or prescribed. You should increase fluids and electrolytes as well as rest over these next several days. If you have any questions or concerns, or if your  symptoms are not improving or if especially if they acutely worsen, please call or stop back to the clinic immediately and we will be happy to help you or go to the ER   ABDOMINAL PAIN RED FLAGS: Seek immediate further care if: symptoms remain the same or worsen over the next 3-7 days, you are unable to keep fluids down, you see blood or mucus in your stool, you vomit black or dark red material, you have a fever of 101.F or higher, you have localized and/or persistent abdominal pain       ED Prescriptions     Medication Sig Dispense Auth. Provider   potassium chloride SA (KLOR-CON M) 20 MEQ tablet Take 1 tablet (20 mEq total) by mouth 2 (two) times daily for 3 days. 6 tablet Eusebio Friendly B, PA-C   ondansetron (ZOFRAN-ODT) 4 MG disintegrating tablet Take 1 tablet (4 mg total) by mouth every 6 (six) hours as needed for vomiting or nausea. 12 tablet Eusebio Friendly B, PA-C   pantoprazole (PROTONIX) 20 MG tablet Take 1 tablet (20 mg total) by mouth daily as needed for heartburn or indigestion. 30 tablet Shirlee Latch, PA-C  I have reviewed the PDMP during this encounter.   Shirlee Latch, PA-C 10/14/23 1906
